# Patient Record
Sex: Male | Born: 1965 | Race: Black or African American | Hispanic: No | Marital: Married | Smoking: Never smoker
Health system: Southern US, Community
[De-identification: ages and names within clinical notes are randomized; demographics above are authoritative.]

## PROBLEM LIST (undated history)

## (undated) DIAGNOSIS — T7840XA Allergy, unspecified, initial encounter: Secondary | ICD-10-CM

## (undated) DIAGNOSIS — Z973 Presence of spectacles and contact lenses: Secondary | ICD-10-CM

## (undated) DIAGNOSIS — I10 Essential (primary) hypertension: Secondary | ICD-10-CM

## (undated) DIAGNOSIS — E785 Hyperlipidemia, unspecified: Secondary | ICD-10-CM

## (undated) DIAGNOSIS — J302 Other seasonal allergic rhinitis: Secondary | ICD-10-CM

## (undated) HISTORY — DX: Hyperlipidemia, unspecified: E78.5

## (undated) HISTORY — DX: Essential (primary) hypertension: I10

## (undated) HISTORY — DX: Allergy, unspecified, initial encounter: T78.40XA

## (undated) HISTORY — PX: COLONOSCOPY: SHX174

## (undated) HISTORY — PX: VASECTOMY: SHX75

---

## 2004-12-19 ENCOUNTER — Ambulatory Visit: Payer: Self-pay | Admitting: Otolaryngology

## 2014-12-17 ENCOUNTER — Ambulatory Visit (INDEPENDENT_AMBULATORY_CARE_PROVIDER_SITE_OTHER): Payer: 59 | Admitting: Family Medicine

## 2014-12-17 ENCOUNTER — Encounter: Payer: Self-pay | Admitting: Family Medicine

## 2014-12-17 ENCOUNTER — Encounter (INDEPENDENT_AMBULATORY_CARE_PROVIDER_SITE_OTHER): Payer: Self-pay

## 2014-12-17 VITALS — BP 118/78 | HR 70 | Temp 98.2°F | Resp 16 | Ht 72.0 in | Wt 232.4 lb

## 2014-12-17 DIAGNOSIS — E349 Endocrine disorder, unspecified: Secondary | ICD-10-CM | POA: Insufficient documentation

## 2014-12-17 DIAGNOSIS — E291 Testicular hypofunction: Secondary | ICD-10-CM | POA: Diagnosis not present

## 2014-12-17 DIAGNOSIS — E785 Hyperlipidemia, unspecified: Secondary | ICD-10-CM | POA: Diagnosis not present

## 2014-12-17 DIAGNOSIS — I10 Essential (primary) hypertension: Secondary | ICD-10-CM | POA: Diagnosis not present

## 2014-12-17 NOTE — Progress Notes (Signed)
Name: Chad Hendricks   MRN: [id number]    DOB: [birth year]   Date:12/17/2014       Progress Note  Subjective  Chief Complaint  Chief Complaint  Patient presents with  . Hypertension  . Hyperlipidemia    Hypertension This is a chronic problem. The current episode started more than 1 year ago. The problem is unchanged. Pertinent negatives include no blurred vision, chest pain, headaches, neck pain, orthopnea, palpitations or shortness of breath. Agents associated with hypertension include steroids. Risk factors for coronary artery disease include dyslipidemia and male gender. Past treatments include ACE inhibitors. The current treatment provides moderate improvement. There are no compliance problems.   Hyperlipidemia This is a chronic problem. The current episode started more than 1 year ago. The problem is uncontrolled. Recent lipid tests were reviewed and are high. Factors aggravating his hyperlipidemia include fatty foods. Pertinent negatives include no chest pain, focal weakness, myalgias or shortness of breath. Current antihyperlipidemic treatment includes statins. The current treatment provides mild improvement of lipids. There are no compliance problems.  Risk factors for coronary artery disease include dyslipidemia, hypertension and male sex.    Hypogonadism  Patient remains in the care of his urologist for hypogonadism. He has received L injections of testosterone the past.. He's had no problem with BPH symptomatology and PSA has been normal by his urologist  Past Medical History  Diagnosis Date  . Hypertension   . Hyperlipidemia   . Allergy     History  Substance Use Topics  . Smoking status: Never Smoker   . Smokeless tobacco: Not on file  . Alcohol Use: 0.0 oz/week    0 Standard drinks or equivalent per week     Current outpatient prescriptions:  .  cetirizine (ZYRTEC) 10 MG tablet, Take by mouth., Disp: , Rfl:  .  Cholecalciferol 50000 UNITS TABS, Take by mouth.,  Disp: , Rfl:  .  clotrimazole-betamethasone (LOTRISONE) cream, , Disp: , Rfl:  .  DHA-EPA-VITAMIN E PO, Take by mouth., Disp: , Rfl:  .  fluticasone (FLONASE) 50 MCG/ACT nasal spray, Place into the nose., Disp: , Rfl:  .  lisinopril (PRINIVIL,ZESTRIL) 20 MG tablet, Take by mouth., Disp: , Rfl:  .  lovastatin (MEVACOR) 40 MG tablet, Take by mouth., Disp: , Rfl:  .  Syringe, Disposable, (2-3CC SYRINGE) 3 ML MISC, Use 1 mL every 14 (fourteen) days., Disp: , Rfl:   Allergies  Allergen Reactions  . Latex Rash    Review of Systems  Constitutional: Negative for fever, chills and weight loss.  HENT: Negative for congestion, hearing loss, sore throat and tinnitus.   Eyes: Negative for blurred vision, double vision and redness.  Respiratory: Negative for cough, hemoptysis and shortness of breath.   Cardiovascular: Negative for chest pain, palpitations, orthopnea, claudication and leg swelling.  Gastrointestinal: Negative for heartburn, nausea, vomiting, diarrhea, constipation and blood in stool.  Genitourinary: Negative for dysuria, urgency, frequency and hematuria.  Musculoskeletal: Positive for joint pain. Negative for myalgias, back pain, falls and neck pain.  Skin: Negative for itching.  Neurological: Negative for dizziness, tingling, tremors, focal weakness, seizures, loss of consciousness, weakness and headaches.  Endo/Heme/Allergies: Does not bruise/bleed easily.  Psychiatric/Behavioral: Negative for depression and substance abuse. The patient is not nervous/anxious and does not have insomnia.      Objective  Filed Vitals:   12/17/14 0920  BP: 118/78  Pulse: 70  Temp: 98.2 F (36.8 C)  Resp: 16  Height: 6' (1.829 m)  Weight: 232 lb  7 oz (105.433 kg)  SpO2: 98%     Physical Exam  Constitutional: He is oriented to person, place, and time and well-developed, well-nourished, and in no distress.  Obese obese  HENT:  Head: Normocephalic.  Eyes: EOM are normal. Pupils are  equal, round, and reactive to light.  Neck: Normal range of motion. Neck supple. No thyromegaly present.  Cardiovascular: Normal rate, regular rhythm and normal heart sounds.   No murmur heard. Pulmonary/Chest: Effort normal and breath sounds normal. No respiratory distress. He has no wheezes.  Abdominal: Soft. Bowel sounds are normal.  Musculoskeletal: He exhibits no edema. Tenderness: both knees.  Lymphadenopathy:    He has no cervical adenopathy.  Neurological: He is alert and oriented to person, place, and time. No cranial nerve deficit. Gait normal. Coordination normal.  Skin: Skin is warm and dry. No rash noted.  Psychiatric: Affect and judgment normal.      Assessment & Plan  1. Hyperlipemia  - Comprehensive metabolic panel - Lipid panel - TSH  2. Essential hypertension Well-controlled  3. Hypotestosteronism  followed by urologist

## 2014-12-17 NOTE — Patient Instructions (Addendum)

## 2014-12-18 ENCOUNTER — Telehealth: Payer: Self-pay | Admitting: Emergency Medicine

## 2014-12-18 LAB — LIPID PANEL
CHOL/HDL RATIO: 2.7 ratio (ref 0.0–5.0)
CHOLESTEROL TOTAL: 178 mg/dL (ref 100–199)
HDL: 66 mg/dL (ref >39)
LDL Calculated: 100 mg/dL — ABNORMAL HIGH (ref 0–99)
Triglycerides: 60 mg/dL (ref 0–149)
VLDL Cholesterol Cal: 12 mg/dL (ref 5–40)

## 2014-12-18 LAB — COMPREHENSIVE METABOLIC PANEL
ALK PHOS: 45 IU/L (ref 39–117)
ALT: 34 IU/L (ref 0–44)
AST: 33 IU/L (ref 0–40)
Albumin/Globulin Ratio: 1.6 (ref 1.1–2.5)
Albumin: 4.4 g/dL (ref 3.5–5.5)
BUN/Creatinine Ratio: 10 (ref 9–20)
BUN: 12 mg/dL (ref 6–24)
Bilirubin Total: 0.5 mg/dL (ref 0.0–1.2)
CO2: 23 mmol/L (ref 18–29)
Calcium: 9.6 mg/dL (ref 8.7–10.2)
Chloride: 98 mmol/L (ref 97–108)
Creatinine, Ser: 1.24 mg/dL (ref 0.76–1.27)
GFR calc Af Amer: 79 mL/min/{1.73_m2} (ref >59)
GFR calc non Af Amer: 68 mL/min/{1.73_m2} (ref >59)
Globulin, Total: 2.7 g/dL (ref 1.5–4.5)
Glucose: 92 mg/dL (ref 65–99)
POTASSIUM: 4.9 mmol/L (ref 3.5–5.2)
SODIUM: 141 mmol/L (ref 134–144)
Total Protein: 7.1 g/dL (ref 6.0–8.5)

## 2014-12-18 LAB — TSH: TSH: 1.1 u[IU]/mL (ref 0.450–4.500)

## 2014-12-18 NOTE — Telephone Encounter (Signed)
Patient notified of lab results

## 2015-01-04 ENCOUNTER — Other Ambulatory Visit: Payer: Self-pay

## 2015-01-04 MED ORDER — LOVASTATIN 40 MG PO TABS: 40.0000 mg | ORAL_TABLET | Freq: Every day | ORAL | Status: DC

## 2015-01-04 MED ORDER — LISINOPRIL 20 MG PO TABS: 20.0000 mg | ORAL_TABLET | Freq: Every day | ORAL | Status: DC

## 2015-05-06 ENCOUNTER — Ambulatory Visit (INDEPENDENT_AMBULATORY_CARE_PROVIDER_SITE_OTHER): Payer: 59 | Admitting: Family Medicine

## 2015-05-06 ENCOUNTER — Ambulatory Visit
Admission: RE | Admit: 2015-05-06 | Discharge: 2015-05-06 | Disposition: A | Discharge status: Home / Self Care | Payer: 59 | Source: Ambulatory Visit | Attending: Family Medicine | Admitting: Family Medicine

## 2015-05-06 ENCOUNTER — Encounter: Payer: Self-pay | Admitting: Family Medicine

## 2015-05-06 VITALS — BP 128/78 | HR 68 | Temp 98.3°F | Resp 18 | Ht 72.0 in | Wt 234.2 lb

## 2015-05-06 DIAGNOSIS — R0781 Pleurodynia: Secondary | ICD-10-CM

## 2015-05-06 DIAGNOSIS — Z23 Encounter for immunization: Secondary | ICD-10-CM | POA: Diagnosis not present

## 2015-05-06 MED ORDER — MELOXICAM 15 MG PO TABS: 15.0000 mg | ORAL_TABLET | Freq: Every day | ORAL | Status: DC

## 2015-05-06 NOTE — Progress Notes (Signed)
Name: Chad Hendricks   MRN: [id number]    DOB: 01-Sep-1965   Date:05/06/2015       Progress Note  Subjective  Chief Complaint  Chief Complaint  Patient presents with  . Abdominal Pain    left side for 3 weeks    HPI  Abdominal pain  Several week complaint of pain and discomfort in the left lower rib and left upper quadrant area. He admits that he's been doing some exercises which are very intensive recent that it requires a lot of upper abdominal musculature movement. He's had no nausea vomiting or dyspepsia. There's been no true chest pain nausea and vomiting melena or hematochezia. He has been present now for 3 weeks. It is exacerbated by exercise and relieved at times by NSAIDs or warm compresses.    Past Medical History  Diagnosis Date  . Hypertension   . Hyperlipidemia   . Allergy     Social History  Substance Use Topics  . Smoking status: Never Smoker   . Smokeless tobacco: Not on file  . Alcohol Use: 0.0 oz/week    0 Standard drinks or equivalent per week     Current outpatient prescriptions:  .  cetirizine (ZYRTEC) 10 MG tablet, Take by mouth., Disp: , Rfl:  .  Cholecalciferol 50000 UNITS TABS, Take by mouth., Disp: , Rfl:  .  clotrimazole-betamethasone (LOTRISONE) cream, , Disp: , Rfl:  .  DHA-EPA-VITAMIN E PO, Take by mouth., Disp: , Rfl:  .  fluticasone (FLONASE) 50 MCG/ACT nasal spray, Place into the nose., Disp: , Rfl:  .  lisinopril (PRINIVIL,ZESTRIL) 20 MG tablet, Take 1 tablet (20 mg total) by mouth daily., Disp: 90 tablet, Rfl: 1 .  lovastatin (MEVACOR) 40 MG tablet, Take 1 tablet (40 mg total) by mouth at bedtime., Disp: 90 tablet, Rfl: 1 .  Syringe, Disposable, (2-3CC SYRINGE) 3 ML MISC, Use 1 mL every 14 (fourteen) days., Disp: , Rfl:  .  testosterone cypionate (DEPOTESTOSTERONE CYPIONATE) 200 MG/ML injection, INJECT 1 ML IN MUSCLE Q 14 DAYS, Disp: , Rfl: 0  Allergies  Allergen Reactions  . Latex Rash    Review of Systems  Constitutional:  Positive for fever and weight loss.  Cardiovascular: Positive for chest pain.  Gastrointestinal: Positive for abdominal pain.     Objective  Filed Vitals:   05/06/15 1042  BP: 128/78  Pulse: 68  Temp: 98.3 F (36.8 C)  Resp: 18  Height: 6' (1.829 m)  Weight: 234 lb 3 oz (106.227 kg)  SpO2: 92%     Physical Exam  Constitutional: He is oriented to person, place, and time and well-developed, well-nourished, and in no distress.  HENT:  Head: Normocephalic.  Eyes: EOM are normal. Pupils are equal, round, and reactive to light.  Neck: Normal range of motion. Neck supple. No thyromegaly present.  Cardiovascular: Normal rate, regular rhythm and normal heart sounds.   No murmur heard. Some discomfort in the left lower rib areas. No step-off or point tenderness  Pulmonary/Chest: Effort normal and breath sounds normal. No respiratory distress. He has no wheezes.  Abdominal: Soft. Bowel sounds are normal.  Tenderness in the rectus abdominis muscles just below the insertion of the left lower rib cartilages  Musculoskeletal: Normal range of motion. He exhibits no edema.  Lymphadenopathy:    He has no cervical adenopathy.  Neurological: He is alert and oriented to person, place, and time. No cranial nerve deficit. Gait normal. Coordination normal.  Skin: Skin is warm and dry. No  rash noted.  Psychiatric: Affect and judgment normal.      Assessment & Plan   1. Need for influenza vaccination Given - Flu Vaccine QUAD 36+ mos PF IM (Fluarix & Fluzone Quad PF)  2. Rib pain on left side Abdominal and rib pain seems to be more localized to the rectus abdominis area and over doing it with his intense exercise regimen currently is encouraged to decrease this. However to be unsafe side obtain x-ray of the ribs recently some meloxicam for the pain and use warm compresses when necessary.  Differential diagnosis includes a remote possibility of splenic hemorrhage or cardiac source - DG Ribs  Unilateral Left; Future - meloxicam (MOBIC) 15 MG tablet; Take 1 tablet (15 mg total) by mouth daily.  Dispense: 30 tablet; Refill: 1

## 2015-05-08 ENCOUNTER — Telehealth: Payer: Self-pay | Admitting: Emergency Medicine

## 2015-05-08 NOTE — Telephone Encounter (Signed)
Left message for patient of xray results

## 2015-05-16 ENCOUNTER — Other Ambulatory Visit: Payer: Self-pay | Admitting: Family Medicine

## 2015-06-19 ENCOUNTER — Ambulatory Visit: Payer: 59 | Admitting: Family Medicine

## 2015-06-21 ENCOUNTER — Encounter: Payer: Self-pay | Admitting: Family Medicine

## 2015-06-21 ENCOUNTER — Encounter (INDEPENDENT_AMBULATORY_CARE_PROVIDER_SITE_OTHER): Payer: Self-pay

## 2015-06-21 ENCOUNTER — Ambulatory Visit (INDEPENDENT_AMBULATORY_CARE_PROVIDER_SITE_OTHER): Payer: 59 | Admitting: Family Medicine

## 2015-06-21 VITALS — BP 138/78 | HR 64 | Temp 98.5°F | Resp 18 | Ht 72.0 in | Wt 234.6 lb

## 2015-06-21 DIAGNOSIS — Z13220 Encounter for screening for lipoid disorders: Secondary | ICD-10-CM | POA: Diagnosis not present

## 2015-06-21 DIAGNOSIS — I10 Essential (primary) hypertension: Secondary | ICD-10-CM

## 2015-06-21 DIAGNOSIS — R109 Unspecified abdominal pain: Secondary | ICD-10-CM

## 2015-06-21 MED ORDER — CLOTRIMAZOLE-BETAMETHASONE 1-0.05 % EX LOTN: TOPICAL_LOTION | Freq: Two times a day (BID) | CUTANEOUS | Status: DC

## 2015-06-21 NOTE — Progress Notes (Signed)
Name: Chad Hendricks   MRN: [id number]    DOB: 1965/11/23   Date:06/21/2015       Progress Note  Subjective  Chief Complaint  Chief Complaint  Patient presents with  . Hyperlipidemia    pt here for 6 month follow up  . testicular hypofunction  . Hypertension    HPI  Hypertension   Patient presents for follow-up of hypertension. It has been present for over 5 years.  Patient states that there is compliance with medical regimen which consists of lisinopril 20 mg daily . There is no end organ disease. Cardiac risk factors include hypertension hyperlipidemia and diabetes.  Exercise regimen consist of regular running and walking .  Diet consist of salt restriction .  Hyperlipidemia  Patient has a history of hyperlipidemia for over 5 years.  Current medical regimen consist of lovastatin 40 mg daily at bedtime .  Compliance is good .  Diet and exercise are currently followed regularly .  Risk factors for cardiovascular disease include hyperlipidemia hypertension .   There have been no side effects from the medication.      Hypo-testosterone is in.  Patient is being followed by endocrinologist for his hypo-testosterone is. He is on testosterone cypionate 200 mg/mL testosterone levels and PSA being [facility]    left-sided abdominal pain    patient has a several month history now of left lower rib and left upper quadrant pain. This has been thought secondary to exercise size increase. He has not improved with using NSAIDs.   Past Medical History  Diagnosis Date  . Hypertension   . Hyperlipidemia   . Allergy     Social History  Substance Use Topics  . Smoking status: Never Smoker   . Smokeless tobacco: Not on file  . Alcohol Use: 0.0 oz/week    0 Standard drinks or equivalent per week     Current outpatient prescriptions:  .  testosterone enanthate (DELATESTRYL) 200 MG/ML injection, Inject into the muscle., Disp: , Rfl:  .  cetirizine (ZYRTEC) 10 MG  tablet, Take by mouth., Disp: , Rfl:  .  Cholecalciferol 50000 UNITS TABS, Take by mouth., Disp: , Rfl:  .  clotrimazole-betamethasone (LOTRISONE) cream, , Disp: , Rfl:  .  DHA-EPA-VITAMIN E PO, Take by mouth., Disp: , Rfl:  .  fluticasone (FLONASE) 50 MCG/ACT nasal spray, Place into the nose., Disp: , Rfl:  .  lisinopril (PRINIVIL,ZESTRIL) 20 MG tablet, Take 1 tablet by mouth  daily, Disp: 90 tablet, Rfl: 1 .  lovastatin (MEVACOR) 40 MG tablet, Take 1 tablet by mouth at  bedtime, Disp: 90 tablet, Rfl: 1 .  meloxicam (MOBIC) 15 MG tablet, Take 1 tablet (15 mg total) by mouth daily., Disp: 30 tablet, Rfl: 1 .  Syringe, Disposable, (2-3CC SYRINGE) 3 ML MISC, Use 1 mL every 14 (fourteen) days., Disp: , Rfl:  .  testosterone cypionate (DEPOTESTOSTERONE CYPIONATE) 200 MG/ML injection, INJECT 1 ML IN MUSCLE Q 14 DAYS, Disp: , Rfl: 0  Allergies  Allergen Reactions  . Latex Rash    Review of Systems  Constitutional: Negative for fever, chills and weight loss.  HENT: Negative for congestion, hearing loss, sore throat and tinnitus.   Eyes: Negative for blurred vision, double vision and redness.  Respiratory: Negative for cough, hemoptysis and shortness of breath.   Cardiovascular: Negative for chest pain, palpitations, orthopnea, claudication and leg swelling.  Gastrointestinal: Positive for abdominal pain. Negative for heartburn, nausea, vomiting, diarrhea, constipation and blood in stool.  Genitourinary: Negative for dysuria, urgency, frequency and hematuria.  Musculoskeletal: Negative for myalgias, back pain, joint pain, falls and neck pain.       Left lower rib pain  Skin: Negative for itching.  Neurological: Negative for dizziness, tingling, tremors, focal weakness, seizures, loss of consciousness, weakness and headaches.  Endo/Heme/Allergies: Does not bruise/bleed easily.  Psychiatric/Behavioral: Negative for depression and substance abuse. The patient is not nervous/anxious and does not  have insomnia.      Objective  Filed Vitals:   06/21/15 0949  BP: 138/78  Pulse: 64  Temp: 98.5 F (36.9 C)  Resp: 18  Height: 6' (1.829 m)  Weight: 234 lb 9 oz (106.397 kg)  SpO2: 97%     Physical Exam  Constitutional: He is oriented to person, place, and time and well-developed, well-nourished, and in no distress.  HENT:  Head: Normocephalic.  Eyes: EOM are normal. Pupils are equal, round, and reactive to light.  Neck: Normal range of motion. Neck supple. No thyromegaly present.  Cardiovascular: Normal rate, regular rhythm and normal heart sounds.   No murmur heard. Pulmonary/Chest: Effort normal and breath sounds normal. No respiratory distress. He has no wheezes.  Abdominal: Soft. Bowel sounds are normal. There is tenderness.  Musculoskeletal: Normal range of motion. He exhibits no edema.  Lymphadenopathy:    He has no cervical adenopathy.  Neurological: He is alert and oriented to person, place, and time. No cranial nerve deficit. Gait normal. Coordination normal.  Skin: Skin is warm and dry. No rash noted.  Psychiatric: Affect and judgment normal.      Assessment & Plan  1. Left sided abdominal pain Ultrasound and lab work - US Abdomen Limited; Future - CBC - Sed Rate (ESR)  2. Essential hypertension Well-controlled - CBC - Comprehensive Metabolic Panel (CMET) - Sed Rate (ESR)  3. Lipid screening Lipid panel - Lipid panel - TSH - Sed Rate (ESR)

## 2015-06-22 LAB — LIPID PANEL
Chol/HDL Ratio: 4 ratio units (ref 0.0–5.0)
Cholesterol, Total: 194 mg/dL (ref 100–199)
HDL: 49 mg/dL (ref >39)
LDL Calculated: 106 mg/dL — ABNORMAL HIGH (ref 0–99)
TRIGLYCERIDES: 197 mg/dL — AB (ref 0–149)
VLDL CHOLESTEROL CAL: 39 mg/dL (ref 5–40)

## 2015-06-22 LAB — CBC
HEMATOCRIT: 50.6 % (ref 37.5–51.0)
HEMOGLOBIN: 17.1 g/dL (ref 12.6–17.7)
MCH: 29.3 pg (ref 26.6–33.0)
MCHC: 33.8 g/dL (ref 31.5–35.7)
MCV: 87 fL (ref 79–97)
PLATELETS: 205 10*3/uL (ref 150–379)
RBC: 5.84 x10E6/uL — AB (ref 4.14–5.80)
RDW: 13.5 % (ref 12.3–15.4)
WBC: 4.1 10*3/uL (ref 3.4–10.8)

## 2015-06-22 LAB — COMPREHENSIVE METABOLIC PANEL
A/G RATIO: 1.6 (ref 1.1–2.5)
ALT: 29 IU/L (ref 0–44)
AST: 24 IU/L (ref 0–40)
Albumin: 4.5 g/dL (ref 3.5–5.5)
Alkaline Phosphatase: 48 IU/L (ref 39–117)
BILIRUBIN TOTAL: 0.5 mg/dL (ref 0.0–1.2)
BUN/Creatinine Ratio: 9 (ref 9–20)
BUN: 12 mg/dL (ref 6–24)
CALCIUM: 9.7 mg/dL (ref 8.7–10.2)
CHLORIDE: 98 mmol/L (ref 96–106)
CO2: 25 mmol/L (ref 18–29)
Creatinine, Ser: 1.3 mg/dL — ABNORMAL HIGH (ref 0.76–1.27)
GFR calc non Af Amer: 64 mL/min/{1.73_m2} (ref >59)
GFR, EST AFRICAN AMERICAN: 74 mL/min/{1.73_m2} (ref >59)
GLUCOSE: 56 mg/dL — AB (ref 65–99)
Globulin, Total: 2.8 g/dL (ref 1.5–4.5)
POTASSIUM: 4.1 mmol/L (ref 3.5–5.2)
Sodium: 139 mmol/L (ref 134–144)
TOTAL PROTEIN: 7.3 g/dL (ref 6.0–8.5)

## 2015-06-22 LAB — TSH: TSH: 1.38 u[IU]/mL (ref 0.450–4.500)

## 2015-06-22 LAB — SEDIMENTATION RATE: Sed Rate: 2 mm/hr (ref 0–15)

## 2015-07-02 ENCOUNTER — Ambulatory Visit
Admission: RE | Admit: 2015-07-02 | Discharge: 2015-07-02 | Disposition: A | Discharge status: Home / Self Care | Payer: 59 | Source: Ambulatory Visit | Attending: Family Medicine | Admitting: Family Medicine

## 2015-07-02 DIAGNOSIS — R05 Cough: Secondary | ICD-10-CM | POA: Insufficient documentation

## 2015-07-02 DIAGNOSIS — R109 Unspecified abdominal pain: Secondary | ICD-10-CM | POA: Diagnosis present

## 2015-07-08 ENCOUNTER — Telehealth: Payer: Self-pay | Admitting: Emergency Medicine

## 2015-07-08 NOTE — Telephone Encounter (Signed)
Patient notified of results. Will call back if he want the referral to GI

## 2015-07-17 ENCOUNTER — Encounter: Payer: 59 | Admitting: Family Medicine

## 2015-11-26 ENCOUNTER — Other Ambulatory Visit: Payer: Self-pay | Admitting: Family Medicine

## 2016-05-09 ENCOUNTER — Other Ambulatory Visit: Payer: Self-pay | Admitting: Family Medicine

## 2016-06-15 ENCOUNTER — Other Ambulatory Visit: Payer: Self-pay | Admitting: Family Medicine

## 2016-06-15 ENCOUNTER — Ambulatory Visit (INDEPENDENT_AMBULATORY_CARE_PROVIDER_SITE_OTHER): Payer: 59 | Admitting: Family Medicine

## 2016-06-15 ENCOUNTER — Encounter: Payer: Self-pay | Admitting: Family Medicine

## 2016-06-15 VITALS — BP 128/76 | HR 56 | Temp 98.1°F | Resp 18 | Ht 72.0 in | Wt 242.9 lb

## 2016-06-15 DIAGNOSIS — I10 Essential (primary) hypertension: Secondary | ICD-10-CM

## 2016-06-15 DIAGNOSIS — Z23 Encounter for immunization: Secondary | ICD-10-CM | POA: Diagnosis not present

## 2016-06-15 DIAGNOSIS — Z1211 Encounter for screening for malignant neoplasm of colon: Secondary | ICD-10-CM

## 2016-06-15 DIAGNOSIS — E785 Hyperlipidemia, unspecified: Secondary | ICD-10-CM | POA: Diagnosis not present

## 2016-06-15 MED ORDER — LISINOPRIL 20 MG PO TABS: 20.0000 mg | ORAL_TABLET | Freq: Every day | ORAL | 1 refills | Status: DC

## 2016-06-15 MED ORDER — LISINOPRIL 20 MG PO TABS: 20.0000 mg | ORAL_TABLET | Freq: Every day | ORAL | 0 refills | Status: DC

## 2016-06-15 MED ORDER — LOVASTATIN 40 MG PO TABS: 40.0000 mg | ORAL_TABLET | Freq: Every day | ORAL | 1 refills | Status: DC

## 2016-06-15 NOTE — Progress Notes (Signed)
Name: Chad Hendricks   MRN: [id number]    DOB: 1966/03/10   Date:06/15/2016       Progress Note  Subjective  Chief Complaint  Chief Complaint  Patient presents with  . Hypertension    6 month folow up, medication refill  . Hyperlipidemia    Hypertension  This is a chronic problem. The problem is unchanged. The problem is controlled. Pertinent negatives include no blurred vision, chest pain, headaches, palpitations or shortness of breath. Past treatments include ACE inhibitors. There is no history of kidney disease, CAD/MI or CVA.  Hyperlipidemia  This is a chronic problem. The problem is controlled. Recent lipid tests were reviewed and are high. Pertinent negatives include no chest pain, leg pain, myalgias or shortness of breath. Current antihyperlipidemic treatment includes statins. Risk factors for coronary artery disease include dyslipidemia and male sex.   Referral for Colonoscopy: Last colonoscopy was performed in 2013 by Dr. Allen Norris, he found 3 polyps which were removed and advised to folow up with repeat colonoscopy in 2018. His father was diagnosed with colon cancer at age 31 years old.   Past Medical History:  Diagnosis Date  . Allergy   . Hyperlipidemia   . Hypertension     Past Surgical History:  Procedure Laterality Date  . VASECTOMY      Family History  Problem Relation Age of Onset  . Cancer Mother   . Kidney disease Mother   . Kidney disease Father   . Prostate cancer Brother   . Prostate cancer Brother   . Kidney disease Brother   . Diabetes Brother   . Diabetes Brother   . Diabetes Brother     Social History   Social History  . Marital status: Married    Spouse name: N/A  . Number of children: N/A  . Years of education: N/A   Occupational History  . Not on file.   Social History Main Topics  . Smoking status: Never Smoker  . Smokeless tobacco: Not on file  . Alcohol use 0.0 oz/week  . Drug use: No  . Sexual activity: Not on file   Other  Topics Concern  . Not on file   Social History Narrative  . No narrative on file     Current Outpatient Prescriptions:  .  cetirizine (ZYRTEC) 10 MG tablet, Take by mouth., Disp: , Rfl:  .  Cholecalciferol 50000 UNITS TABS, Take by mouth., Disp: , Rfl:  .  DHA-EPA-VITAMIN E PO, Take by mouth., Disp: , Rfl:  .  fluticasone (FLONASE) 50 MCG/ACT nasal spray, Place into the nose., Disp: , Rfl:  .  lisinopril (PRINIVIL,ZESTRIL) 20 MG tablet, Take 1 tablet by mouth  daily, Disp: 90 tablet, Rfl: 0 .  lovastatin (MEVACOR) 40 MG tablet, Take 1 tablet by mouth at  bedtime, Disp: 90 tablet, Rfl: 0 .  meloxicam (MOBIC) 15 MG tablet, Take 1 tablet (15 mg total) by mouth daily., Disp: 30 tablet, Rfl: 1 .  Syringe, Disposable, (2-3CC SYRINGE) 3 ML MISC, Use 1 mL every 14 (fourteen) days., Disp: , Rfl:  .  testosterone cypionate (DEPOTESTOSTERONE CYPIONATE) 200 MG/ML injection, INJECT 1 ML IN MUSCLE Q 14 DAYS, Disp: , Rfl: 0 .  testosterone enanthate (DELATESTRYL) 200 MG/ML injection, Inject into the muscle., Disp: , Rfl:  .  clotrimazole-betamethasone (LOTRISONE) cream, , Disp: , Rfl:   Allergies  Allergen Reactions  . Latex Rash     Review of Systems  Constitutional: Negative for fever and weight loss.  Eyes: Negative for blurred vision.  Respiratory: Negative for shortness of breath.   Cardiovascular: Negative for chest pain and palpitations.  Gastrointestinal: Negative for abdominal pain, blood in stool, constipation, diarrhea, nausea and vomiting.  Musculoskeletal: Negative for myalgias.  Neurological: Negative for headaches.     Objective  Vitals:   06/15/16 1130  BP: 128/76  Pulse: (!) 56  Resp: 18  Temp: 98.1 F (36.7 C)  TempSrc: Oral  SpO2: 97%  Weight: 242 lb 14.4 oz (110.2 kg)  Height: 6' (1.829 m)    Physical Exam  Constitutional: He is oriented to person, place, and time and well-developed, well-nourished, and in no distress.  HENT:  Head: Normocephalic and  atraumatic.  Cardiovascular: Normal rate, regular rhythm and normal heart sounds.   No murmur heard. Pulmonary/Chest: Effort normal and breath sounds normal. He has no wheezes.  Abdominal: Soft. Bowel sounds are normal. There is no tenderness.  Musculoskeletal:       Right ankle: He exhibits no swelling.       Left ankle: He exhibits no swelling.  Neurological: He is alert and oriented to person, place, and time.  Psychiatric: Mood, memory, affect and judgment normal.  Nursing note and vitals reviewed.    Assessment & Plan  1. Need for influenza vaccination  - Flu Vaccine QUAD 36+ mos PF IM (Fluarix & Fluzone Quad PF)  2. Colon cancer screening  - Ambulatory referral to Gastroenterology  3. Essential hypertension  - lisinopril (PRINIVIL,ZESTRIL) 20 MG tablet; Take 1 tablet (20 mg total) by mouth daily.  Dispense: 90 tablet; Refill: 1  4. Hyperlipidemia, unspecified hyperlipidemia type  - Lipid Profile - COMPLETE METABOLIC PANEL WITH GFR - lovastatin (MEVACOR) 40 MG tablet; Take 1 tablet (40 mg total) by mouth at bedtime.  Dispense: 90 tablet; Refill: 1  Deeanne Deininger [facility] 06/15/2016 11:40 AM

## 2016-06-16 LAB — COMPREHENSIVE METABOLIC PANEL
A/G RATIO: 1.7 (ref 1.2–2.2)
ALT: 40 IU/L (ref 0–44)
AST: 35 IU/L (ref 0–40)
Albumin: 4.4 g/dL (ref 3.5–5.5)
Alkaline Phosphatase: 41 IU/L (ref 39–117)
BUN/Creatinine Ratio: 9 (ref 9–20)
BUN: 12 mg/dL (ref 6–24)
Bilirubin Total: 0.9 mg/dL (ref 0.0–1.2)
CALCIUM: 9.7 mg/dL (ref 8.7–10.2)
CO2: 29 mmol/L (ref 18–29)
CREATININE: 1.34 mg/dL — AB (ref 0.76–1.27)
Chloride: 99 mmol/L (ref 96–106)
GFR, EST AFRICAN AMERICAN: 71 mL/min/{1.73_m2} (ref >59)
GFR, EST NON AFRICAN AMERICAN: 61 mL/min/{1.73_m2} (ref >59)
GLOBULIN, TOTAL: 2.6 g/dL (ref 1.5–4.5)
Glucose: 74 mg/dL (ref 65–99)
POTASSIUM: 4.7 mmol/L (ref 3.5–5.2)
Sodium: 141 mmol/L (ref 134–144)
TOTAL PROTEIN: 7 g/dL (ref 6.0–8.5)

## 2016-06-16 LAB — LIPID PANEL WITH LDL/HDL RATIO
CHOLESTEROL TOTAL: 206 mg/dL — AB (ref 100–199)
HDL: 54 mg/dL (ref >39)
LDL Calculated: 136 mg/dL — ABNORMAL HIGH (ref 0–99)
LDL/HDL RATIO: 2.5 ratio (ref 0.0–3.6)
Triglycerides: 82 mg/dL (ref 0–149)
VLDL Cholesterol Cal: 16 mg/dL (ref 5–40)

## 2016-07-02 ENCOUNTER — Telehealth: Payer: Self-pay

## 2016-07-02 ENCOUNTER — Other Ambulatory Visit: Payer: Self-pay

## 2016-07-02 NOTE — Telephone Encounter (Signed)
Gastroenterology Pre-Procedure Review  Request Date:  Requesting Physician: Dr.   PATIENT REVIEW QUESTIONS: The patient responded to the following health history questions as indicated:    1. Are you having any GI issues? no 2. Do you have a personal history of Polyps? yes (Colon polyps) 3. Do you have a family history of Colon Cancer or Polyps? yes (Yes, father) 4. Diabetes Mellitus? no 5. Joint replacements in the past 12 months?no 6. Major health problems in the past 3 months?no 7. Any artificial heart valves, MVP, or defibrillator?no    MEDICATIONS & ALLERGIES:    Patient reports the following regarding taking any anticoagulation/antiplatelet therapy:   Plavix, Coumadin, Eliquis, Xarelto, Lovenox, Pradaxa, Brilinta, or Effient? no Aspirin? no  Patient confirms/reports the following medications:  Current Outpatient Prescriptions  Medication Sig Dispense Refill  . cetirizine (ZYRTEC) 10 MG tablet Take by mouth.    . Cholecalciferol 50000 UNITS TABS Take by mouth.    . clotrimazole-betamethasone (LOTRISONE) cream     . DHA-EPA-VITAMIN E PO Take by mouth.    . fluticasone (FLONASE) 50 MCG/ACT nasal spray Place into the nose.    Marland Kitchen lisinopril (PRINIVIL,ZESTRIL) 20 MG tablet Take 1 tablet (20 mg total) by mouth daily. 90 tablet 1  . lisinopril (PRINIVIL,ZESTRIL) 20 MG tablet Take 1 tablet (20 mg total) by mouth daily. 30 tablet 0  . lovastatin (MEVACOR) 40 MG tablet Take 1 tablet (40 mg total) by mouth at bedtime. 90 tablet 1  . meloxicam (MOBIC) 15 MG tablet Take 1 tablet (15 mg total) by mouth daily. 30 tablet 1  . Syringe, Disposable, (2-3CC SYRINGE) 3 ML MISC Use 1 mL every 14 (fourteen) days.    Marland Kitchen testosterone cypionate (DEPOTESTOSTERONE CYPIONATE) 200 MG/ML injection INJECT 1 ML IN MUSCLE Q 14 DAYS  0  . testosterone enanthate (DELATESTRYL) 200 MG/ML injection Inject into the muscle.     No current facility-administered medications for this visit.     Patient  confirms/reports the following allergies:  Allergies  Allergen Reactions  . Latex Rash    No orders of the defined types were placed in this encounter.   AUTHORIZATION INFORMATION Primary Insurance: 1D#: Group #:  Secondary Insurance: 1D#: Group #:  SCHEDULE INFORMATION: Date: 07/20/16 Time: Location: [facility]

## 2016-07-16 ENCOUNTER — Encounter: Payer: Self-pay | Admitting: *Deleted

## 2016-07-17 NOTE — Discharge Instructions (Signed)

## 2016-07-20 ENCOUNTER — Ambulatory Visit
Admission: RE | Admit: 2016-07-20 | Discharge: 2016-07-20 | Disposition: A | Discharge status: Home / Self Care | Payer: 59 | Source: Ambulatory Visit | Attending: Gastroenterology | Admitting: Gastroenterology

## 2016-07-20 ENCOUNTER — Ambulatory Visit: Payer: 59 | Admitting: Anesthesiology

## 2016-07-20 ENCOUNTER — Encounter
Admission: RE | Disposition: A | Discharge status: Home / Self Care | Payer: Self-pay | Source: Ambulatory Visit | Attending: Gastroenterology

## 2016-07-20 DIAGNOSIS — K640 First degree hemorrhoids: Secondary | ICD-10-CM | POA: Diagnosis not present

## 2016-07-20 DIAGNOSIS — Z79899 Other long term (current) drug therapy: Secondary | ICD-10-CM | POA: Insufficient documentation

## 2016-07-20 DIAGNOSIS — E785 Hyperlipidemia, unspecified: Secondary | ICD-10-CM | POA: Insufficient documentation

## 2016-07-20 DIAGNOSIS — Z860100 Personal history of colon polyps, unspecified: Secondary | ICD-10-CM

## 2016-07-20 DIAGNOSIS — D120 Benign neoplasm of cecum: Secondary | ICD-10-CM

## 2016-07-20 DIAGNOSIS — Z1211 Encounter for screening for malignant neoplasm of colon: Secondary | ICD-10-CM | POA: Diagnosis not present

## 2016-07-20 DIAGNOSIS — I10 Essential (primary) hypertension: Secondary | ICD-10-CM | POA: Insufficient documentation

## 2016-07-20 DIAGNOSIS — Z8719 Personal history of other diseases of the digestive system: Secondary | ICD-10-CM | POA: Diagnosis not present

## 2016-07-20 DIAGNOSIS — Z7951 Long term (current) use of inhaled steroids: Secondary | ICD-10-CM | POA: Diagnosis not present

## 2016-07-20 DIAGNOSIS — D122 Benign neoplasm of ascending colon: Secondary | ICD-10-CM | POA: Diagnosis not present

## 2016-07-20 HISTORY — PX: COLONOSCOPY WITH PROPOFOL: SHX5780

## 2016-07-20 HISTORY — DX: Presence of spectacles and contact lenses: Z97.3

## 2016-07-20 SURGERY — COLONOSCOPY WITH PROPOFOL
Anesthesia: Monitor Anesthesia Care | Wound class: Contaminated

## 2016-07-20 MED ORDER — OXYCODONE HCL 5 MG PO TABS: 5.0000 mg | ORAL_TABLET | Freq: Once | ORAL | Status: DC | PRN

## 2016-07-20 MED ORDER — LACTATED RINGERS IV SOLN
INTRAVENOUS | Status: DC | PRN
  Administered 2016-07-20: 09:00:00 via INTRAVENOUS

## 2016-07-20 MED ORDER — LACTATED RINGERS IV SOLN: INTRAVENOUS | Status: DC

## 2016-07-20 MED ORDER — LIDOCAINE HCL (CARDIAC) 20 MG/ML IV SOLN
INTRAVENOUS | Status: DC | PRN
  Administered 2016-07-20: 40 mg via INTRAVENOUS

## 2016-07-20 MED ORDER — STERILE WATER FOR IRRIGATION IR SOLN
Status: DC | PRN
  Administered 2016-07-20: 10:00:00

## 2016-07-20 MED ORDER — OXYCODONE HCL 5 MG/5ML PO SOLN: 5.0000 mg | Freq: Once | ORAL | Status: DC | PRN

## 2016-07-20 MED ORDER — PROPOFOL 10 MG/ML IV BOLUS
INTRAVENOUS | Status: DC | PRN
  Administered 2016-07-20 (×6): 50 mg via INTRAVENOUS
  Administered 2016-07-20: 100 mg via INTRAVENOUS
  Administered 2016-07-20: 50 mg via INTRAVENOUS

## 2016-07-20 SURGICAL SUPPLY — 23 items
CANISTER SUCT 1200ML W/VALVE (MISCELLANEOUS) ×2 IMPLANT
CLIP HMST 235XBRD CATH ROT (MISCELLANEOUS) IMPLANT
CLIP RESOLUTION 360 11X235 (MISCELLANEOUS)
FCP ESCP3.2XJMB 240X2.8X (MISCELLANEOUS)
FORCEPS BIOP RAD 4 LRG CAP 4 (CUTTING FORCEPS) IMPLANT
FORCEPS BIOP RJ4 240 W/NDL (MISCELLANEOUS)
FORCEPS ESCP3.2XJMB 240X2.8X (MISCELLANEOUS) IMPLANT
GOWN CVR UNV OPN BCK APRN NK (MISCELLANEOUS) ×2 IMPLANT
GOWN ISOL THUMB LOOP REG UNIV (MISCELLANEOUS) ×2
INJECTOR VARIJECT VIN23 (MISCELLANEOUS) IMPLANT
KIT DEFENDO VALVE AND CONN (KITS) IMPLANT
KIT ENDO PROCEDURE OLY (KITS) ×2 IMPLANT
MARKER SPOT ENDO TATTOO 5ML (MISCELLANEOUS) IMPLANT
PAD GROUND ADULT SPLIT (MISCELLANEOUS) IMPLANT
PROBE APC STR FIRE (PROBE) IMPLANT
RETRIEVER NET ROTH 2.5X230 LF (MISCELLANEOUS) IMPLANT
SNARE SHORT THROW 13M SML OVAL (MISCELLANEOUS) ×2 IMPLANT
SNARE SHORT THROW 30M LRG OVAL (MISCELLANEOUS) IMPLANT
SNARE SNG USE RND 15MM (INSTRUMENTS) IMPLANT
SPOT EX ENDOSCOPIC TATTOO (MISCELLANEOUS)
TRAP ETRAP POLY (MISCELLANEOUS) ×2 IMPLANT
VARIJECT INJECTOR VIN23 (MISCELLANEOUS)
WATER STERILE IRR 250ML POUR (IV SOLUTION) ×2 IMPLANT

## 2016-07-20 NOTE — H&P (Signed)
Lucilla Lame, MD [facility] [address] [place], [place] [address] Phone: [phone] Fax : [phone]  Primary Care Physician:  Ashok Norris, MD Primary Gastroenterologist:  Dr. Allen Norris  Pre-Procedure History & Physical: HPI:  Chad Hendricks is a 51 y.o. male is here for an colonoscopy.   Past Medical History:  Diagnosis Date  . Allergy   . Hyperlipidemia   . Hypertension   . Wears contact lenses     Past Surgical History:  Procedure Laterality Date  . COLONOSCOPY    . VASECTOMY      Prior to Admission medications   Medication Sig Start Date End Date Taking? Authorizing Provider  cetirizine (ZYRTEC) 10 MG tablet Take by mouth.   Yes Historical Provider, MD  Cholecalciferol 50000 UNITS TABS Take by mouth. 09/27/08  Yes Historical Provider, MD  DHA-EPA-VITAMIN E PO Take by mouth. 11/03/07  Yes Historical Provider, MD  fluticasone (FLONASE) 50 MCG/ACT nasal spray Place into the nose. 01/10/10  Yes Historical Provider, MD  lisinopril (PRINIVIL,ZESTRIL) 20 MG tablet Take 1 tablet (20 mg total) by mouth daily. 06/15/16  Yes Roselee Nova, MD  lovastatin (MEVACOR) 40 MG tablet Take 1 tablet (40 mg total) by mouth at bedtime. 06/15/16  Yes Roselee Nova, MD  testosterone cypionate (DEPOTESTOSTERONE CYPIONATE) 200 MG/ML injection INJECT 1 ML IN MUSCLE Q 14 DAYS 04/30/15  Yes Historical Provider, MD  lisinopril (PRINIVIL,ZESTRIL) 20 MG tablet Take 1 tablet (20 mg total) by mouth daily. 06/15/16   Roselee Nova, MD  Syringe, Disposable, (2-3CC SYRINGE) 3 ML MISC Use 1 mL every 14 (fourteen) days. 01/23/13   Historical Provider, MD  testosterone enanthate (DELATESTRYL) 200 MG/ML injection Inject into the muscle. 05/28/15   Historical Provider, MD    Allergies as of 07/02/2016 - Review Complete 06/15/2016  Allergen Reaction Noted  . Latex Rash 12/17/2014    Family History  Problem Relation Age of Onset  . Cancer Mother   . Kidney disease Mother   . Kidney disease Father    . Prostate cancer Brother   . Prostate cancer Brother   . Kidney disease Brother   . Diabetes Brother   . Diabetes Brother   . Diabetes Brother     Social History   Social History  . Marital status: Married    Spouse name: N/A  . Number of children: N/A  . Years of education: N/A   Occupational History  . Not on file.   Social History Main Topics  . Smoking status: Never Smoker  . Smokeless tobacco: Never Used  . Alcohol use 1.2 oz/week    2 Glasses of wine per week  . Drug use: No  . Sexual activity: Not on file   Other Topics Concern  . Not on file   Social History Narrative  . No narrative on file    Review of Systems: See HPI, otherwise negative ROS  Physical Exam: BP (!) 141/93   Pulse (!) 54   Temp 98.9 F (37.2 C) (Temporal)   Ht 6' (1.829 m)   Wt 233 lb (105.7 kg)   SpO2 96%   BMI 31.60 kg/m  General:   Alert,  pleasant and cooperative in NAD Head:  Normocephalic and atraumatic. Neck:  Supple; no masses or thyromegaly. Lungs:  Clear throughout to auscultation.    Heart:  Regular rate and rhythm. Abdomen:  Soft, nontender and nondistended. Normal bowel sounds, without guarding, and without rebound.   Neurologic:  Alert and  oriented x4;  grossly normal neurologically.  Impression/Plan: DELONTAE SALIB is here for an colonoscopy to be performed for history of colon polyps  Risks, benefits, limitations, and alternatives regarding  colonoscopy have been reviewed with the patient.  Questions have been answered.  All parties agreeable.   Lucilla Lame, MD  07/20/2016, 9:21 AM

## 2016-07-20 NOTE — Op Note (Signed)
[facility]enter[facility] Patient Name: Chad Hendricks Procedure Date: 07/20/2016 9:22 AM MRN: [id number] Account #: [id number] Date of Birth: [birth year] Admit Type: Outpatient Age: 51 Room: [facility] OR ROOM 01 Gender: Male Note Status: Finalized Procedure:            Colonoscopy Indications:          High risk colon cancer surveillance: Personal history                        of colonic polyps Providers:            Lucilla Lame MD, MD Referring MD:         Ashok Norris, MD (Referring MD) Medicines:            Propofol per Anesthesia Procedure:            Pre-Anesthesia Assessment:                       - Prior to the procedure, a History and Physical was                        performed, and patient medications and allergies were                        reviewed. The patient's tolerance of previous                        anesthesia was also reviewed. The risks and benefits of                        the procedure and the sedation options and risks were                        discussed with the patient. All questions were                        answered, and informed consent was obtained. Prior                        Anticoagulants: The patient has taken no previous                        anticoagulant or antiplatelet agents. ASA Grade                        Assessment: II - A patient with mild systemic disease.                        After reviewing the risks and benefits, the patient was                        deemed in satisfactory condition to undergo the                        procedure.                       After obtaining informed consent, the colonoscope was                        passed under  direct vision. Throughout the procedure,                        the patient's blood pressure, pulse, and oxygen                        saturations were monitored continuously. The [place] (S#: [id number]) was introduced  through                        the anus and advanced to the the cecum, identified by                        appendiceal orifice and ileocecal valve. The                        colonoscopy was performed without difficulty. The                        patient tolerated the procedure well. The quality of                        the bowel preparation was excellent. Findings:      The perianal and digital rectal examinations were normal.      Two sessile polyps were found in the cecum. The polyps were 2 to 3 mm in       size. These polyps were removed with a cold snare. Resection and       retrieval were complete.      A 4 mm polyp was found in the ascending colon. The polyp was       pedunculated. The polyp was removed with a cold snare. Resection and       retrieval were complete.      Non-bleeding internal hemorrhoids were found during retroflexion. The       hemorrhoids were Grade I (internal hemorrhoids that do not prolapse). Impression:           - Two 2 to 3 mm polyps in the cecum, removed with a                        cold snare. Resected and retrieved.                       - One 4 mm polyp in the ascending colon, removed with a                        cold snare. Resected and retrieved.                       - Non-bleeding internal hemorrhoids. Recommendation:       - Discharge patient to home.                       - Resume previous diet.                       - Continue present medications.                       -  Await pathology results.                       - Repeat colonoscopy in 5 years for surveillance. Procedure Code(s):    --- Professional ---                       [phone], Colonoscopy, flexible; with removal of tumor(s),                        polyp(s), or other lesion(s) by snare technique Diagnosis Code(s):    --- Professional ---                       Z86.010, Personal history of colonic polyps                       D12.0, Benign neoplasm of cecum                        D12.2, Benign neoplasm of ascending colon CPT copyright 2016 American Medical Association. All rights reserved. The codes documented in this report are preliminary and upon coder review may  be revised to meet current compliance requirements. Lucilla Lame MD, MD 07/20/2016 9:50:06 AM This report has been signed electronically. Number of Addenda: 0 Note Initiated On: 07/20/2016 9:22 AM Scope Withdrawal Time: 0 hours 7 minutes 34 seconds  Total Procedure Duration: 0 hours 11 minutes 54 seconds       [facility]

## 2016-07-20 NOTE — Anesthesia Procedure Notes (Signed)
Procedure Name: MAC Date/Time: 07/20/2016 9:28 AM Performed by: Janna Arch Pre-anesthesia Checklist: Patient identified, Emergency Drugs available, Suction available and Patient being monitored Patient Re-evaluated:Patient Re-evaluated prior to inductionOxygen Delivery Method: Nasal cannula

## 2016-07-20 NOTE — Anesthesia Postprocedure Evaluation (Signed)
Anesthesia Post Note  Patient: Chad Hendricks  Procedure(s) Performed: Procedure(s) (LRB): COLONOSCOPY WITH PROPOFOL (N/A)  Patient location during evaluation: PACU Anesthesia Type: MAC Level of consciousness: awake and alert Pain management: pain level controlled Vital Signs Assessment: post-procedure vital signs reviewed and stable Respiratory status: spontaneous breathing Cardiovascular status: blood pressure returned to baseline Postop Assessment: no headache Anesthetic complications: no    Jaci Standard, III,  Trypp Heckmann D

## 2016-07-20 NOTE — Anesthesia Preprocedure Evaluation (Signed)
Anesthesia Evaluation  Patient identified by MRN, date of birth, ID band Patient awake    Reviewed: Allergy & Precautions, H&P , NPO status , Patient's Chart, lab work & pertinent test results  Airway Mallampati: II  TM Distance: >3 FB Neck ROM: full    Dental no notable dental hx.    Pulmonary neg pulmonary ROS,    Pulmonary exam normal        Cardiovascular hypertension, On Medications Normal cardiovascular exam     Neuro/Psych negative neurological ROS     GI/Hepatic negative GI ROS, Neg liver ROS,   Endo/Other  negative endocrine ROS  Renal/GU negative Renal ROS  negative genitourinary   Musculoskeletal   Abdominal   Peds  Hematology negative hematology ROS (+)   Anesthesia Other Findings   Reproductive/Obstetrics                             Anesthesia Physical Anesthesia Plan  ASA: II  Anesthesia Plan: MAC   Post-op Pain Management:    Induction:   Airway Management Planned:   Additional Equipment:   Intra-op Plan:   Post-operative Plan:   Informed Consent: I have reviewed the patients History and Physical, chart, labs and discussed the procedure including the risks, benefits and alternatives for the proposed anesthesia with the patient or authorized representative who has indicated his/her understanding and acceptance.     Plan Discussed with:   Anesthesia Plan Comments:         Anesthesia Quick Evaluation  

## 2016-07-20 NOTE — Transfer of Care (Signed)
Immediate Anesthesia Transfer of Care Note  Patient: Chad Hendricks  Procedure(s) Performed: Procedure(s): COLONOSCOPY WITH PROPOFOL (N/A)  Patient Location: PACU  Anesthesia Type: MAC  Level of Consciousness: awake, alert  and patient cooperative  Airway and Oxygen Therapy: Patient Spontanous Breathing and Patient connected to supplemental oxygen  Post-op Assessment: Post-op Vital signs reviewed, Patient's Cardiovascular Status Stable, Respiratory Function Stable, Patent Airway and No signs of Nausea or vomiting  Post-op Vital Signs: Reviewed and stable  Complications: No apparent anesthesia complications

## 2016-07-21 ENCOUNTER — Encounter: Payer: Self-pay | Admitting: Gastroenterology

## 2016-07-22 LAB — SURGICAL PATHOLOGY

## 2016-07-24 ENCOUNTER — Encounter: Payer: Self-pay | Admitting: Gastroenterology

## 2016-11-27 ENCOUNTER — Other Ambulatory Visit: Payer: Self-pay | Admitting: Family Medicine

## 2016-11-27 DIAGNOSIS — E785 Hyperlipidemia, unspecified: Secondary | ICD-10-CM

## 2016-11-27 DIAGNOSIS — I10 Essential (primary) hypertension: Secondary | ICD-10-CM

## 2016-12-14 ENCOUNTER — Ambulatory Visit: Payer: 59 | Admitting: Family Medicine

## 2017-01-25 ENCOUNTER — Other Ambulatory Visit: Payer: Self-pay | Admitting: Emergency Medicine

## 2017-01-25 DIAGNOSIS — I10 Essential (primary) hypertension: Secondary | ICD-10-CM

## 2017-01-25 MED ORDER — LISINOPRIL 20 MG PO TABS: 20.0000 mg | ORAL_TABLET | Freq: Every day | ORAL | 0 refills | Status: DC

## 2017-02-01 ENCOUNTER — Ambulatory Visit: Payer: 59 | Admitting: Family Medicine

## 2017-02-03 ENCOUNTER — Ambulatory Visit (INDEPENDENT_AMBULATORY_CARE_PROVIDER_SITE_OTHER): Payer: 59 | Admitting: Family Medicine

## 2017-02-03 ENCOUNTER — Other Ambulatory Visit: Payer: Self-pay | Admitting: Family Medicine

## 2017-02-03 ENCOUNTER — Encounter: Payer: Self-pay | Admitting: Family Medicine

## 2017-02-03 VITALS — BP 130/73 | HR 60 | Temp 98.0°F | Resp 16 | Ht 73.0 in | Wt 241.9 lb

## 2017-02-03 DIAGNOSIS — I10 Essential (primary) hypertension: Secondary | ICD-10-CM | POA: Diagnosis not present

## 2017-02-03 DIAGNOSIS — E785 Hyperlipidemia, unspecified: Secondary | ICD-10-CM

## 2017-02-03 DIAGNOSIS — Z23 Encounter for immunization: Secondary | ICD-10-CM

## 2017-02-03 DIAGNOSIS — E669 Obesity, unspecified: Secondary | ICD-10-CM | POA: Diagnosis not present

## 2017-02-03 MED ORDER — LOVASTATIN 40 MG PO TABS: 40.0000 mg | ORAL_TABLET | Freq: Every day | ORAL | 1 refills | Status: DC

## 2017-02-03 MED ORDER — LISINOPRIL 20 MG PO TABS: 20.0000 mg | ORAL_TABLET | Freq: Every day | ORAL | 1 refills | Status: DC

## 2017-02-03 MED ORDER — LISINOPRIL 20 MG PO TABS: 20.0000 mg | ORAL_TABLET | Freq: Every day | ORAL | 0 refills | Status: DC

## 2017-02-03 NOTE — Progress Notes (Signed)
Name: Chad Hendricks   MRN: [id number]    DOB: 17-Oct-1965   Date:02/03/2017       Progress Note  Subjective  Chief Complaint  Chief Complaint  Patient presents with  . Follow-up    6 mo  . Medication Refill    Hypertension  This is a chronic problem. The problem is unchanged. The problem is controlled. Pertinent negatives include no blurred vision, chest pain, headaches, palpitations or shortness of breath. Past treatments include ACE inhibitors. There is no history of kidney disease, CAD/MI or CVA.  Hyperlipidemia  This is a chronic problem. The problem is uncontrolled. Recent lipid tests were reviewed and are high. Pertinent negatives include no chest pain, leg pain, myalgias or shortness of breath. Current antihyperlipidemic treatment includes statins. Risk factors for coronary artery disease include dyslipidemia and male sex.    Past Medical History:  Diagnosis Date  . Allergy   . Hyperlipidemia   . Hypertension   . Wears contact lenses     Past Surgical History:  Procedure Laterality Date  . COLONOSCOPY    . COLONOSCOPY WITH PROPOFOL N/A 07/20/2016   Procedure: COLONOSCOPY WITH PROPOFOL;  Surgeon: Lucilla Lame, MD;  Location: [place];  Service: Endoscopy;  Laterality: N/A;  . VASECTOMY      Family History  Problem Relation Age of Onset  . Cancer Mother   . Kidney disease Mother   . Kidney disease Father   . Prostate cancer Brother   . Prostate cancer Brother   . Kidney disease Brother   . Diabetes Brother   . Diabetes Brother   . Diabetes Brother     Social History   Social History  . Marital status: Married    Spouse name: N/A  . Number of children: N/A  . Years of education: N/A   Occupational History  . Not on file.   Social History Main Topics  . Smoking status: Never Smoker  . Smokeless tobacco: Never Used  . Alcohol use 1.2 oz/week    2 Glasses of wine per week  . Drug use: No  . Sexual activity: Not on file   Other Topics  Concern  . Not on file   Social History Narrative  . No narrative on file     Current Outpatient Prescriptions:  .  Omega-3 Fatty Acids (OMEGA 3 500) 500 MG CAPS, Take 500 mg by mouth daily., Disp: , Rfl:  .  cetirizine (ZYRTEC) 10 MG tablet, Take by mouth., Disp: , Rfl:  .  DHA-EPA-VITAMIN E PO, Take by mouth., Disp: , Rfl:  .  fluticasone (FLONASE) 50 MCG/ACT nasal spray, Place into the nose., Disp: , Rfl:  .  lisinopril (PRINIVIL,ZESTRIL) 20 MG tablet, Take 1 tablet (20 mg total) by mouth daily., Disp: 90 tablet, Rfl: 1 .  lisinopril (PRINIVIL,ZESTRIL) 20 MG tablet, Take 1 tablet (20 mg total) by mouth daily., Disp: 10 tablet, Rfl: 0 .  lovastatin (MEVACOR) 40 MG tablet, Take 1 tablet (40 mg total) by mouth at bedtime., Disp: 90 tablet, Rfl: 1 .  Syringe, Disposable, (2-3CC SYRINGE) 3 ML MISC, Use 1 mL every 14 (fourteen) days., Disp: , Rfl:  .  testosterone cypionate (DEPOTESTOSTERONE CYPIONATE) 200 MG/ML injection, INJECT 1 ML IN MUSCLE Q 14 DAYS, Disp: , Rfl: 0 .  testosterone enanthate (DELATESTRYL) 200 MG/ML injection, Inject into the muscle., Disp: , Rfl:   Allergies  Allergen Reactions  . Latex Rash    Only if wears gloves for extended period  Review of Systems  Eyes: Negative for blurred vision.  Respiratory: Negative for shortness of breath.   Cardiovascular: Negative for chest pain and palpitations.  Musculoskeletal: Negative for myalgias.  Neurological: Negative for headaches.      Objective  Vitals:   02/03/17 1415  BP: 130/73  Pulse: 60  Resp: 16  Temp: 98 F (36.7 C)  TempSrc: Oral  SpO2: 98%  Weight: 241 lb 14.4 oz (109.7 kg)  Height: 6\' 1"  (1.854 m)    Physical Exam  Constitutional: He is oriented to person, place, and time and well-developed, well-nourished, and in no distress.  HENT:  Head: Normocephalic and atraumatic.  Cardiovascular: Normal rate, regular rhythm and normal heart sounds.   No murmur heard. Pulmonary/Chest: Effort  normal and breath sounds normal.  Abdominal: Soft. Bowel sounds are normal.  Musculoskeletal: He exhibits no edema.  Neurological: He is alert and oriented to person, place, and time.  Psychiatric: Mood, memory, affect and judgment normal.  Nursing note and vitals reviewed.      Assessment & Plan   1. Essential hypertension BP stable on present hypertensive treatment - lisinopril (PRINIVIL,ZESTRIL) 20 MG tablet; Take 1 tablet (20 mg total) by mouth daily.  Dispense: 90 tablet; Refill: 1 - lisinopril (PRINIVIL,ZESTRIL) 20 MG tablet; Take 1 tablet (20 mg total) by mouth daily.  Dispense: 20 tablet; Refill: 0  2. Hyperlipidemia, unspecified hyperlipidemia type  - lovastatin (MEVACOR) 40 MG tablet; Take 1 tablet (40 mg total) by mouth at bedtime.  Dispense: 90 tablet; Refill: 1 - Lipid panel - lovastatin (MEVACOR) 40 MG tablet; Take 1 tablet (40 mg total) by mouth at bedtime.  Dispense: 90 tablet; Refill: 1  3. Need for influenza vaccination  - Flu Vaccine QUAD 6+ mos PF IM (Fluarix Quad PF)  4. Obesity (BMI 30-39.9) Completed [facility] paperwork, advised to increase physical activity, continue with dietary and lifestyle changes   Juniel Groene [facility] 02/03/2017 2:25 PM

## 2017-02-04 LAB — LIPID PANEL W/O CHOL/HDL RATIO
CHOLESTEROL TOTAL: 185 mg/dL (ref 100–199)
HDL: 54 mg/dL (ref >39)
LDL Calculated: 112 mg/dL — ABNORMAL HIGH (ref 0–99)
TRIGLYCERIDES: 94 mg/dL (ref 0–149)
VLDL Cholesterol Cal: 19 mg/dL (ref 5–40)

## 2017-02-06 ENCOUNTER — Other Ambulatory Visit: Payer: Self-pay | Admitting: Family Medicine

## 2017-02-06 DIAGNOSIS — I10 Essential (primary) hypertension: Secondary | ICD-10-CM

## 2017-02-12 ENCOUNTER — Other Ambulatory Visit: Payer: Self-pay | Admitting: Family Medicine

## 2017-04-12 ENCOUNTER — Other Ambulatory Visit: Payer: Self-pay | Admitting: Family Medicine

## 2017-04-12 DIAGNOSIS — I10 Essential (primary) hypertension: Secondary | ICD-10-CM

## 2017-04-12 DIAGNOSIS — E785 Hyperlipidemia, unspecified: Secondary | ICD-10-CM

## 2017-05-31 ENCOUNTER — Other Ambulatory Visit: Payer: Self-pay | Admitting: Family Medicine

## 2017-05-31 DIAGNOSIS — I10 Essential (primary) hypertension: Secondary | ICD-10-CM

## 2017-06-29 ENCOUNTER — Other Ambulatory Visit: Payer: Self-pay | Admitting: Family Medicine

## 2017-06-29 DIAGNOSIS — E785 Hyperlipidemia, unspecified: Secondary | ICD-10-CM

## 2017-07-22 ENCOUNTER — Encounter: Payer: Self-pay | Admitting: Family Medicine

## 2017-07-22 ENCOUNTER — Ambulatory Visit: Payer: 59 | Admitting: Family Medicine

## 2017-07-22 VITALS — BP 132/76 | HR 56 | Temp 98.6°F | Resp 14 | Ht 73.0 in | Wt 242.0 lb

## 2017-07-22 DIAGNOSIS — J3489 Other specified disorders of nose and nasal sinuses: Secondary | ICD-10-CM

## 2017-07-22 DIAGNOSIS — R0981 Nasal congestion: Secondary | ICD-10-CM

## 2017-07-22 DIAGNOSIS — I10 Essential (primary) hypertension: Secondary | ICD-10-CM | POA: Diagnosis not present

## 2017-07-22 DIAGNOSIS — E7800 Pure hypercholesterolemia, unspecified: Secondary | ICD-10-CM | POA: Diagnosis not present

## 2017-07-22 MED ORDER — FLUTICASONE PROPIONATE 50 MCG/ACT NA SUSP: 2.0000 | Freq: Every day | NASAL | 2 refills | Status: DC

## 2017-07-22 NOTE — Progress Notes (Signed)
Name: Chad Hendricks   MRN: [id number]    DOB: [birth year]   Date:07/22/2017       Progress Note  Subjective  Chief Complaint  Chief Complaint  Patient presents with  . Follow-up    6 month  . Medication Refill    Hypertension  This is a chronic problem. The problem is unchanged. The problem is controlled. Pertinent negatives include no blurred vision, chest pain, headaches, palpitations or shortness of breath. Past treatments include ACE inhibitors. There is no history of kidney disease, CAD/MI or CVA.  Hyperlipidemia  This is a chronic problem. The problem is uncontrolled. Recent lipid tests were reviewed and are high. Pertinent negatives include no chest pain, leg pain, myalgias or shortness of breath. Current antihyperlipidemic treatment includes statins. Risk factors for coronary artery disease include dyslipidemia and male sex.   Pt. is also requesting refills for Flonase, he has symptoms of allergy with nasal congestion, rhinorrhea, stuffiness and sneezing. He has used Flonase in the past for similar symptoms with good efficacy.    Past Medical History:  Diagnosis Date  . Allergy   . Hyperlipidemia   . Hypertension   . Wears contact lenses     Past Surgical History:  Procedure Laterality Date  . COLONOSCOPY    . COLONOSCOPY WITH PROPOFOL N/A 07/20/2016   Procedure: COLONOSCOPY WITH PROPOFOL;  Surgeon: Lucilla Lame, MD;  Location: [place];  Service: Endoscopy;  Laterality: N/A;  . VASECTOMY      Family History  Problem Relation Age of Onset  . Cancer Mother   . Kidney disease Mother   . Kidney disease Father   . Prostate cancer Brother   . Prostate cancer Brother   . Kidney disease Brother   . Diabetes Brother   . Diabetes Brother   . Diabetes Brother   . Hypertension Maternal Grandmother   . Hypertension Maternal Grandfather   . Hypertension Paternal Grandmother   . Hypertension Paternal Grandfather     Social History   Socioeconomic History   . Marital status: Married    Spouse name: Not on file  . Number of children: Not on file  . Years of education: Not on file  . Highest education level: Not on file  Social Needs  . Financial resource strain: Not on file  . Food insecurity - worry: Not on file  . Food insecurity - inability: Not on file  . Transportation needs - medical: Not on file  . Transportation needs - non-medical: Not on file  Occupational History  . Not on file  Tobacco Use  . Smoking status: Never Smoker  . Smokeless tobacco: Never Used  Substance and Sexual Activity  . Alcohol use: Yes    Alcohol/week: 1.2 oz    Types: 2 Glasses of wine per week  . Drug use: No  . Sexual activity: Yes  Other Topics Concern  . Not on file  Social History Narrative  . Not on file     Current Outpatient Medications:  .  cetirizine (ZYRTEC) 10 MG tablet, Take by mouth., Disp: , Rfl:  .  Cholecalciferol (VITAMIN D3) 5000 units CAPS, Take 1 capsule by mouth daily., Disp: , Rfl:  .  co-enzyme Q-10 50 MG capsule, Take 50 mg by mouth daily., Disp: , Rfl:  .  DHA-EPA-VITAMIN E PO, Take 1 tablet by mouth daily. , Disp: , Rfl:  .  fluticasone (FLONASE) 50 MCG/ACT nasal spray, Place 2 sprays into both nostrils daily. , Disp: ,  Rfl:  .  lisinopril (PRINIVIL,ZESTRIL) 20 MG tablet, TAKE 1 TABLET BY MOUTH  DAILY, Disp: 90 tablet, Rfl: 1 .  lovastatin (MEVACOR) 40 MG tablet, TAKE 1 TABLET BY MOUTH AT  BEDTIME, Disp: 90 tablet, Rfl: 1 .  Multiple Vitamins-Minerals (MULTIVITAMIN ADULT PO), Take 1 capsule by mouth daily., Disp: , Rfl:  .  Omega-3 Fatty Acids (OMEGA 3 500) 500 MG CAPS, Take 500 mg by mouth daily., Disp: , Rfl:  .  Syringe, Disposable, (2-3CC SYRINGE) 3 ML MISC, Use 1 mL every 14 (fourteen) days., Disp: , Rfl:  .  testosterone cypionate (DEPOTESTOSTERONE CYPIONATE) 200 MG/ML injection, INJECT 1 ML IN MUSCLE Q 14 DAYS, Disp: , Rfl: 0 .  testosterone enanthate (DELATESTRYL) 200 MG/ML injection, Inject into the muscle.,  Disp: , Rfl:   Allergies  Allergen Reactions  . Latex Rash    Only if wears gloves for extended period     Review of Systems  Eyes: Negative for blurred vision.  Respiratory: Negative for shortness of breath.   Cardiovascular: Negative for chest pain and palpitations.  Musculoskeletal: Negative for myalgias.  Neurological: Negative for headaches.    Objective  Vitals:   07/22/17 0949  BP: 132/76  Pulse: (!) 56  Resp: 14  Temp: 98.6 F (37 C)  TempSrc: Oral  SpO2: 95%  Weight: 242 lb (109.8 kg)  Height: 6\' 1"  (1.854 m)    Physical Exam  Constitutional: He is oriented to person, place, and time and well-developed, well-nourished, and in no distress.  HENT:  Head: Normocephalic and atraumatic.  Nose: Right sinus exhibits no maxillary sinus tenderness and no frontal sinus tenderness. Left sinus exhibits no maxillary sinus tenderness and no frontal sinus tenderness.  Mouth/Throat: Oropharynx is clear and moist.  Nasal turbinate hypertrophy  Cardiovascular: Normal rate, regular rhythm and normal heart sounds.  No murmur heard. Pulmonary/Chest: Effort normal and breath sounds normal. He has no wheezes.  Abdominal: Soft. Bowel sounds are normal.  Musculoskeletal: He exhibits no edema.  Neurological: He is alert and oriented to person, place, and time.  Psychiatric: Mood, memory, affect and judgment normal.  Nursing note and vitals reviewed.      Assessment & Plan  1. Essential hypertension BP stable on present anti-hypertensive therapy.  2. Pure hypercholesterolemia  - Lipid panel  3. Nasal congestion with rhinorrhea Refill for Flonase provided - fluticasone (FLONASE) 50 MCG/ACT nasal spray; Place 2 sprays into both nostrils daily.  Dispense: 16 g; Refill: 2   Effie Janoski [facility] 07/22/2017 10:01 AM

## 2017-07-31 LAB — LIPID PANEL
Chol/HDL Ratio: 2.9 ratio (ref 0.0–5.0)
Cholesterol, Total: 203 mg/dL — ABNORMAL HIGH (ref 100–199)
HDL: 69 mg/dL (ref >39)
LDL CALC: 118 mg/dL — AB (ref 0–99)
Triglycerides: 79 mg/dL (ref 0–149)
VLDL CHOLESTEROL CAL: 16 mg/dL (ref 5–40)

## 2017-08-04 ENCOUNTER — Other Ambulatory Visit: Payer: Self-pay | Admitting: Family Medicine

## 2017-08-04 MED ORDER — ATORVASTATIN CALCIUM 40 MG PO TABS: 40.0000 mg | ORAL_TABLET | Freq: Every day | ORAL | 3 refills | Status: DC

## 2017-08-04 NOTE — Progress Notes (Signed)
Change statin 

## 2017-09-14 ENCOUNTER — Other Ambulatory Visit: Payer: Self-pay

## 2017-09-14 DIAGNOSIS — R0981 Nasal congestion: Secondary | ICD-10-CM

## 2017-09-14 DIAGNOSIS — J3489 Other specified disorders of nose and nasal sinuses: Secondary | ICD-10-CM

## 2017-09-14 MED ORDER — FLUTICASONE PROPIONATE 50 MCG/ACT NA SUSP: 2.0000 | Freq: Every day | NASAL | 5 refills | Status: DC

## 2017-09-27 ENCOUNTER — Telehealth: Payer: Self-pay | Admitting: Family Medicine

## 2017-09-27 NOTE — Telephone Encounter (Signed)
I personally called patient The lovastatin was auto-refill; showed fill on 09/24/17, and insurance notified me of two overlapping fills for statins He has been taking the newer statin, atorvastatin He will mail the lovastatin back when it comes

## 2017-10-19 ENCOUNTER — Encounter: Payer: Self-pay | Admitting: Family Medicine

## 2017-10-19 ENCOUNTER — Ambulatory Visit: Payer: Managed Care, Other (non HMO) | Admitting: Family Medicine

## 2017-10-19 DIAGNOSIS — E669 Obesity, unspecified: Secondary | ICD-10-CM | POA: Insufficient documentation

## 2017-10-19 DIAGNOSIS — Z6832 Body mass index (BMI) 32.0-32.9, adult: Secondary | ICD-10-CM | POA: Insufficient documentation

## 2017-10-19 DIAGNOSIS — E349 Endocrine disorder, unspecified: Secondary | ICD-10-CM

## 2017-10-19 DIAGNOSIS — E7800 Pure hypercholesterolemia, unspecified: Secondary | ICD-10-CM | POA: Diagnosis not present

## 2017-10-19 DIAGNOSIS — I10 Essential (primary) hypertension: Secondary | ICD-10-CM | POA: Diagnosis not present

## 2017-10-19 DIAGNOSIS — Z5181 Encounter for therapeutic drug level monitoring: Secondary | ICD-10-CM | POA: Insufficient documentation

## 2017-10-19 NOTE — Assessment & Plan Note (Signed)
See AVS

## 2017-10-19 NOTE — Assessment & Plan Note (Signed)
Check SGPT, Cr, and K+

## 2017-10-19 NOTE — Assessment & Plan Note (Signed)
Managed by urologist 

## 2017-10-19 NOTE — Assessment & Plan Note (Signed)
See what chol is on the new medicine; try limit saturated fats

## 2017-10-19 NOTE — Assessment & Plan Note (Addendum)
See AVS; he has a very muscular build and likely has only a few pounds to lose

## 2017-10-19 NOTE — Assessment & Plan Note (Addendum)
Try DASH guidelines; continue medicine; check Cr and K+

## 2017-10-19 NOTE — Patient Instructions (Addendum)
Try to follow the DASH guidelines (DASH stands for Dietary Approaches to Stop Hypertension). Try to limit the sodium in your diet to no more than 1,500mg  of sodium per day. Certainly try to not exceed 2,000 mg per day at the very most. Do not add salt when cooking or at the table.  Check the sodium amount on labels when shopping, and choose items lower in sodium when given a choice. Avoid or limit foods that already contain a lot of sodium. Eat a diet rich in fruits and vegetables and whole grains, and try to lose weight if overweight or obese  Check out the information at familydoctor.org entitled "Nutrition for Weight Loss: What You Need to Know about Fad Diets" Try to lose between 1-2 pounds per week by taking in fewer calories and burning off more calories You can succeed by limiting portions, limiting foods dense in calories and fat, becoming more active, and drinking 8 glasses of water a day (64 ounces) Don't skip meals, especially breakfast, as skipping meals may alter your metabolism Do not use over-the-counter weight loss pills or gimmicks that claim rapid weight loss A healthy BMI (or body mass index) is between 18.5 and 24.9 You can calculate your ideal BMI at the [place] website ClubMonetize.fr  Try to use PLAIN allergy medicine without the decongestant Avoid: phenylephrine, phenylpropanolamine, and pseudoephredine   Obesity, Adult Obesity is the condition of having too much total body fat. Being overweight or obese means that your weight is greater than what is considered healthy for your body size. Obesity is determined by a measurement called BMI. BMI is an estimate of body fat and is calculated from height and weight. For adults, a BMI of 30 or higher is considered obese. Obesity can eventually lead to other health concerns and major illnesses, including:  Stroke.  Coronary artery disease (CAD).  Type 2 diabetes.  Some types of  cancer, including cancers of the colon, breast, uterus, and gallbladder.  Osteoarthritis.  High blood pressure (hypertension).  High cholesterol.  Sleep apnea.  Gallbladder stones.  Infertility problems.  What are the causes? The main cause of obesity is taking in (consuming) more calories than your body uses for energy. Other factors that contribute to this condition may include:  Being born with genes that make you more likely to become obese.  Having a medical condition that causes obesity. These conditions include: ? Hypothyroidism. ? Polycystic ovarian syndrome (PCOS). ? Binge-eating disorder. ? Cushing syndrome.  Taking certain medicines, such as steroids, antidepressants, and seizure medicines.  Not being physically active (sedentary lifestyle).  Living where there are limited places to exercise safely or buy healthy foods.  Not getting enough sleep.  What increases the risk? The following factors may increase your risk of this condition:  Having a family history of obesity.  Being a woman of African-American descent.  Being a man of Hispanic descent.  What are the signs or symptoms? Having excessive body fat is the main symptom of this condition. How is this diagnosed? This condition may be diagnosed based on:  Your symptoms.  Your medical history.  A physical exam. Your health care provider may measure: ? Your BMI. If you are an adult with a BMI between 25 and less than 30, you are considered overweight. If you are an adult with a BMI of 30 or higher, you are considered obese. ? The distances around your hips and your waist (circumferences). These may be compared to each other to help diagnose  your condition. ? Your skinfold thickness. Your health care provider may gently pinch a fold of your skin and measure it.  How is this treated? Treatment for this condition often includes changing your lifestyle. Treatment may include some or all of the  following:  Dietary changes. Work with your health care provider and a dietitian to set a weight-loss goal that is healthy and reasonable for you. Dietary changes may include eating: ? Smaller portions. A portion size is the amount of a particular food that is healthy for you to eat at one time. This varies from person to person. ? Low-calorie or low-fat options. ? More whole grains, fruits, and vegetables.  Regular physical activity. This may include aerobic activity (cardio) and strength training.  Medicine to help you lose weight. Your health care provider may prescribe medicine if you are unable to lose 1 pound a week after 6 weeks of eating more healthily and doing more physical activity.  Surgery. Surgical options may include gastric banding and gastric bypass. Surgery may be done if: ? Other treatments have not helped to improve your condition. ? You have a BMI of 40 or higher. ? You have life-threatening health problems related to obesity.  Follow these instructions at home:  Eating and drinking   Follow recommendations from your health care provider about what you eat and drink. Your health care provider may advise you to: ? Limit fast foods, sweets, and processed snack foods. ? Choose low-fat options, such as low-fat milk instead of whole milk. ? Eat 5 or more servings of fruits or vegetables every day. ? Eat at home more often. This gives you more control over what you eat. ? Choose healthy foods when you eat out. ? Learn what a healthy portion size is. ? Keep low-fat snacks on hand. ? Avoid sugary drinks, such as soda, fruit juice, iced tea sweetened with sugar, and flavored milk. ? Eat a healthy breakfast.  Drink enough water to keep your urine clear or pale yellow.  Do not go without eating for long periods of time (do not fast) or follow a fad diet. Fasting and fad diets can be unhealthy and even dangerous. Physical Activity  Exercise regularly, as told by your  health care provider. Ask your health care provider what types of exercise are safe for you and how often you should exercise.  Warm up and stretch before being active.  Cool down and stretch after being active.  Rest between periods of activity. Lifestyle  Limit the time that you spend in front of your TV, computer, or video game system.  Find ways to reward yourself that do not involve food.  Limit alcohol intake to no more than 1 drink a day for nonpregnant women and 2 drinks a day for men. One drink equals 12 oz of beer, 5 oz of wine, or 1 oz of hard liquor. General instructions  Keep a weight loss journal to keep track of the food you eat and how much you exercise you get.  Take over-the-counter and prescription medicines only as told by your health care provider.  Take vitamins and supplements only as told by your health care provider.  Consider joining a support group. Your health care provider may be able to recommend a support group.  Keep all follow-up visits as told by your health care provider. This is important. Contact a health care provider if:  You are unable to meet your weight loss goal after 6 weeks of dietary  and lifestyle changes. This information is not intended to replace advice given to you by your health care provider. Make sure you discuss any questions you have with your health care provider. Document Released: 07/02/2004 Document Revised: 10/28/2015 Document Reviewed: 03/13/2015 Elsevier Interactive Patient Education  2018 [place] Eating Plan DASH stands for "Dietary Approaches to Stop Hypertension." The DASH eating plan is a healthy eating plan that has been shown to reduce high blood pressure (hypertension). It may also reduce your risk for type 2 diabetes, heart disease, and stroke. The DASH eating plan may also help with weight loss. What are tips for following this plan? General guidelines  Avoid eating more than 2,300 mg  (milligrams) of salt (sodium) a day. If you have hypertension, you may need to reduce your sodium intake to 1,500 mg a day.  Limit alcohol intake to no more than 1 drink a day for nonpregnant women and 2 drinks a day for men. One drink equals 12 oz of beer, 5 oz of wine, or 1 oz of hard liquor.  Work with your health care provider to maintain a healthy body weight or to lose weight. Ask what an ideal weight is for you.  Get at least 30 minutes of exercise that causes your heart to beat faster (aerobic exercise) most days of the week. Activities may include walking, swimming, or biking.  Work with your health care provider or diet and nutrition specialist (dietitian) to adjust your eating plan to your individual calorie needs. Reading food labels  Check food labels for the amount of sodium per serving. Choose foods with less than 5 percent of the Daily Value of sodium. Generally, foods with less than 300 mg of sodium per serving fit into this eating plan.  To find whole grains, look for the word "whole" as the first word in the ingredient list. Shopping  Buy products labeled as "low-sodium" or "no salt added."  Buy fresh foods. Avoid canned foods and premade or frozen meals. Cooking  Avoid adding salt when cooking. Use salt-free seasonings or herbs instead of table salt or sea salt. Check with your health care provider or pharmacist before using salt substitutes.  Do not fry foods. Cook foods using healthy methods such as baking, boiling, grilling, and broiling instead.  Cook with heart-healthy oils, such as olive, canola, soybean, or sunflower oil. Meal planning   Eat a balanced diet that includes: ? 5 or more servings of fruits and vegetables each day. At each meal, try to fill half of your plate with fruits and vegetables. ? Up to 6-8 servings of whole grains each day. ? Less than 6 oz of lean meat, poultry, or fish each day. A 3-oz serving of meat is about the same size as a deck  of cards. One egg equals 1 oz. ? 2 servings of low-fat dairy each day. ? A serving of nuts, seeds, or beans 5 times each week. ? Heart-healthy fats. Healthy fats called Omega-3 fatty acids are found in foods such as flaxseeds and coldwater fish, like sardines, salmon, and mackerel.  Limit how much you eat of the following: ? Canned or prepackaged foods. ? Food that is high in trans fat, such as fried foods. ? Food that is high in saturated fat, such as fatty meat. ? Sweets, desserts, sugary drinks, and other foods with added sugar. ? Full-fat dairy products.  Do not salt foods before eating.  Try to eat at least 2 vegetarian meals each week.  Eat more home-cooked food and less restaurant, buffet, and fast food.  When eating at a restaurant, ask that your food be prepared with less salt or no salt, if possible. What foods are recommended? The items listed may not be a complete list. Talk with your dietitian about what dietary choices are best for you. Grains Whole-grain or whole-wheat bread. Whole-grain or whole-wheat pasta. Brown rice. [place]. Bulgur. Whole-grain and low-sodium cereals. Pita bread. Low-fat, low-sodium crackers. Whole-wheat flour tortillas. Vegetables Fresh or frozen vegetables (raw, steamed, roasted, or grilled). Low-sodium or reduced-sodium tomato and vegetable juice. Low-sodium or reduced-sodium tomato sauce and tomato paste. Low-sodium or reduced-sodium canned vegetables. Fruits All fresh, dried, or frozen fruit. Canned fruit in natural juice (without added sugar). Meat and other protein foods Skinless chicken or [place]. Ground chicken or [place]. Pork with fat trimmed off. Fish and seafood. Egg whites. Dried beans, peas, or lentils. Unsalted nuts, nut butters, and seeds. Unsalted canned beans. Lean cuts of beef with fat trimmed off. Low-sodium, lean deli meat. Dairy Low-fat (1%) or fat-free (skim) milk. Fat-free, low-fat, or reduced-fat cheeses. Nonfat,  low-sodium ricotta or cottage cheese. Low-fat or nonfat yogurt. Low-fat, low-sodium cheese. Fats and oils Soft margarine without trans fats. Vegetable oil. Low-fat, reduced-fat, or light mayonnaise and salad dressings (reduced-sodium). Canola, safflower, olive, soybean, and sunflower oils. Avocado. Seasoning and other foods Herbs. Spices. Seasoning mixes without salt. Unsalted popcorn and pretzels. Fat-free sweets. What foods are not recommended? The items listed may not be a complete list. Talk with your dietitian about what dietary choices are best for you. Grains Baked goods made with fat, such as croissants, muffins, or some breads. Dry pasta or rice meal packs. Vegetables Creamed or fried vegetables. Vegetables in a cheese sauce. Regular canned vegetables (not low-sodium or reduced-sodium). Regular canned tomato sauce and paste (not low-sodium or reduced-sodium). Regular tomato and vegetable juice (not low-sodium or reduced-sodium). Angie Fava. Olives. Fruits Canned fruit in a light or heavy syrup. Fried fruit. Fruit in cream or butter sauce. Meat and other protein foods Fatty cuts of meat. Ribs. Fried meat. Berniece Salines. Sausage. Bologna and other processed lunch meats. Salami. Fatback. Hotdogs. Bratwurst. Salted nuts and seeds. Canned beans with added salt. Canned or smoked fish. Whole eggs or egg yolks. Chicken or [place] with skin. Dairy Whole or 2% milk, cream, and half-and-half. Whole or full-fat cream cheese. Whole-fat or sweetened yogurt. Full-fat cheese. Nondairy creamers. Whipped toppings. Processed cheese and cheese spreads. Fats and oils Butter. Stick margarine. Lard. Shortening. Ghee. Bacon fat. Tropical oils, such as coconut, palm kernel, or palm oil. Seasoning and other foods Salted popcorn and pretzels. Onion salt, garlic salt, seasoned salt, table salt, and sea salt. Worcestershire sauce. Tartar sauce. Barbecue sauce. Teriyaki sauce. Soy sauce, including reduced-sodium. Steak sauce.  Canned and packaged gravies. Fish sauce. Oyster sauce. Cocktail sauce. Horseradish that you find on the shelf. Ketchup. Mustard. Meat flavorings and tenderizers. Bouillon cubes. Hot sauce and Tabasco sauce. Premade or packaged marinades. Premade or packaged taco seasonings. Relishes. Regular salad dressings. Where to find more information:  National Heart, Lung, and [place]: https://wilson-eaton.com/  American Heart Association: www.heart.org Summary  The DASH eating plan is a healthy eating plan that has been shown to reduce high blood pressure (hypertension). It may also reduce your risk for type 2 diabetes, heart disease, and stroke.  With the DASH eating plan, you should limit salt (sodium) intake to 2,300 mg a day. If you have hypertension, you may need to reduce your sodium intake  to 1,500 mg a day.  When on the DASH eating plan, aim to eat more fresh fruits and vegetables, whole grains, lean proteins, low-fat dairy, and heart-healthy fats.  Work with your health care provider or diet and nutrition specialist (dietitian) to adjust your eating plan to your individual calorie needs. This information is not intended to replace advice given to you by your health care provider. Make sure you discuss any questions you have with your health care provider. Document Released: 05/14/2011 Document Revised: 05/18/2016 Document Reviewed: 05/18/2016 Elsevier Interactive Patient Education  [employer].

## 2017-10-19 NOTE — Progress Notes (Signed)
BP 136/84   Pulse (!) 57   Temp 98.2 F (36.8 C) (Oral)   Resp 14   Ht 6\' 1"  (1.854 m)   Wt 244 lb 1.6 oz (110.7 kg)   SpO2 96%   BMI 32.21 kg/m    Subjective:    Patient ID: Chad Hendricks, male    DOB: 11/22/1965, 52 y.o.   MRN: [id number]  HPI: Chad Hendricks is a 52 y.o. male  Chief Complaint  Patient presents with  . Follow-up    Former [facility] pt  . Medication Refill    HPI  Patient is new to me; previous provider left our practice  HTN; not adding salt to food; diagnosed around 1997; runs in the family; on lisinopril; used to be on HCTZ; stopped that, too low; no chest pain  Allergies; getting better now; worse a few weeks ago; plain antihistamines  High cholesterol; runs in the family; trying to avoid fatty meats; trying to get whole grains; wasn't on the lipitor than; now will be on atorv instead of lovastatin; also on omega-3 Lab Results  Component Value Date   CHOL 203 (H) 07/30/2017   HDL 69 07/30/2017   LDLCALC 118 (H) 07/30/2017   TRIG 79 07/30/2017   CHOLHDL 2.9 07/30/2017   Obesity; muscular build, but doctor stopped the testosterone supplement, so more tired  Low testosterone; was seeing urologist; strong family hx of prostate cancer; stopped the testosterone  Kidney issues in the family; both parents; no NSAIDs; noticed slight increase in Cr over last 3 draws  He read about zinc, to help decrease duration of a cold  Depression screen [facility] H 2/9 10/19/2017 07/22/2017 02/03/2017 06/21/2015 12/17/2014  Decreased Interest 0 0 0 0 0  Down, Depressed, Hopeless 0 0 0 0 0  PHQ - 2 Score 0 0 0 0 0    Relevant past medical, surgical, family and social history reviewed Past Medical History:  Diagnosis Date  . Allergy   . Hyperlipidemia   . Hypertension   . Wears contact lenses    Past Surgical History:  Procedure Laterality Date  . COLONOSCOPY    . COLONOSCOPY WITH PROPOFOL N/A 07/20/2016   Procedure: COLONOSCOPY WITH PROPOFOL;  Surgeon: Lucilla Lame, MD;  Location: [place];  Service: Endoscopy;  Laterality: N/A;  . VASECTOMY     Family History  Problem Relation Age of Onset  . Cancer Mother   . Kidney disease Mother   . Kidney disease Father   . Prostate cancer Brother   . Prostate cancer Brother   . Kidney disease Brother   . Diabetes Brother   . Diabetes Brother   . Diabetes Brother   . Hypertension Maternal Grandmother   . Hypertension Maternal Grandfather   . Hypertension Paternal Grandmother   . Hypertension Paternal Grandfather    Social History   Tobacco Use  . Smoking status: Never Smoker  . Smokeless tobacco: Never Used  Substance Use Topics  . Alcohol use: Yes    Alcohol/week: 1.2 oz    Types: 2 Glasses of wine per week  . Drug use: No    Interim medical history since last visit reviewed. Allergies and medications reviewed  Review of Systems  Constitutional: Negative for unexpected weight change.  Cardiovascular: Negative for chest pain and leg swelling.   Per HPI unless specifically indicated above     Objective:    BP 136/84   Pulse (!) 57   Temp 98.2 F (36.8 C) (  Oral)   Resp 14   Ht 6\' 1"  (1.854 m)   Wt 244 lb 1.6 oz (110.7 kg)   SpO2 96%   BMI 32.21 kg/m   Wt Readings from Last 3 Encounters:  10/19/17 244 lb 1.6 oz (110.7 kg)  07/22/17 242 lb (109.8 kg)  02/03/17 241 lb 14.4 oz (109.7 kg)    Physical Exam  Constitutional: He appears well-developed and well-nourished. No distress.  Muscular build  HENT:  Head: Normocephalic and atraumatic.  Eyes: EOM are normal. No scleral icterus.  Neck: No thyromegaly present.  Cardiovascular: Regular rhythm. Bradycardia present.  Pulmonary/Chest: Effort normal and breath sounds normal.  Abdominal: Soft. Bowel sounds are normal. He exhibits no distension.  Musculoskeletal: He exhibits no edema.  Neurological: He is alert.  Skin: Skin is warm and dry. No pallor.  Psychiatric: He has a normal mood and affect. His behavior is  normal. Judgment and thought content normal.    Results for orders placed or performed in visit on 07/22/17  Lipid panel  Result Value Ref Range   Cholesterol, Total 203 (H) 100 - 199 mg/dL   Triglycerides 79 0 - 149 mg/dL   HDL 69 >39 mg/dL   VLDL Cholesterol Cal 16 5 - 40 mg/dL   LDL Calculated 118 (H) 0 - 99 mg/dL   Chol/HDL Ratio 2.9 0.0 - 5.0 ratio      Assessment & Plan:   Problem List Items Addressed This Visit      Cardiovascular and Mediastinum   Essential hypertension, benign    Try DASH guidelines; continue medicine; check Cr and K+        Other   Obesity (BMI 30.0-34.9) (Chronic)    See AVS; he has a very muscular build and likely has only a few pounds to lose      Medication monitoring encounter    Check SGPT, Cr, and K+      Relevant Orders   Comprehensive metabolic panel   Hypotestosteronism    Managed by urologist      Hyperlipidemia (Chronic)    See what chol is on the new medicine; try limit saturated fats      Relevant Orders   Lipid panel   BMI 32.0-32.9,adult    See AVS          Follow up plan: Return in about 6 months (around 04/21/2018) for follow-up visit with Dr. Sanda Klein.  An after-visit summary was printed and given to the patient at [facility].  Please see the patient instructions which may contain other information and recommendations beyond what is mentioned above in the assessment and plan.  No orders of the defined types were placed in this encounter.   Orders Placed This Encounter  Procedures  . Comprehensive metabolic panel  . Lipid panel

## 2017-10-23 LAB — COMPREHENSIVE METABOLIC PANEL
ALBUMIN: 4.4 g/dL (ref 3.5–5.5)
ALT: 48 IU/L — ABNORMAL HIGH (ref 0–44)
AST: 36 IU/L (ref 0–40)
Albumin/Globulin Ratio: 1.7 (ref 1.2–2.2)
Alkaline Phosphatase: 53 IU/L (ref 39–117)
BUN / CREAT RATIO: 12 (ref 9–20)
BUN: 16 mg/dL (ref 6–24)
Bilirubin Total: 0.6 mg/dL (ref 0.0–1.2)
CALCIUM: 9.7 mg/dL (ref 8.7–10.2)
CO2: 25 mmol/L (ref 20–29)
CREATININE: 1.37 mg/dL — AB (ref 0.76–1.27)
Chloride: 100 mmol/L (ref 96–106)
GFR, EST AFRICAN AMERICAN: 69 mL/min/{1.73_m2} (ref >59)
GFR, EST NON AFRICAN AMERICAN: 59 mL/min/{1.73_m2} — AB (ref >59)
GLOBULIN, TOTAL: 2.6 g/dL (ref 1.5–4.5)
GLUCOSE: 83 mg/dL (ref 65–99)
Potassium: 4.8 mmol/L (ref 3.5–5.2)
SODIUM: 139 mmol/L (ref 134–144)
TOTAL PROTEIN: 7 g/dL (ref 6.0–8.5)

## 2017-10-23 LAB — LIPID PANEL
CHOL/HDL RATIO: 3 ratio (ref 0.0–5.0)
Cholesterol, Total: 186 mg/dL (ref 100–199)
HDL: 63 mg/dL (ref >39)
LDL CALC: 109 mg/dL — AB (ref 0–99)
Triglycerides: 70 mg/dL (ref 0–149)
VLDL CHOLESTEROL CAL: 14 mg/dL (ref 5–40)

## 2017-10-25 ENCOUNTER — Other Ambulatory Visit: Payer: Self-pay | Admitting: Family Medicine

## 2017-10-25 DIAGNOSIS — I10 Essential (primary) hypertension: Secondary | ICD-10-CM

## 2017-10-25 MED ORDER — LISINOPRIL 20 MG PO TABS: 20.0000 mg | ORAL_TABLET | Freq: Every day | ORAL | 1 refills | Status: DC

## 2017-10-25 NOTE — Progress Notes (Signed)
Refilled ACE-I

## 2017-12-06 ENCOUNTER — Other Ambulatory Visit: Payer: Self-pay

## 2017-12-06 MED ORDER — LOVASTATIN 40 MG PO TABS: 40.0000 mg | ORAL_TABLET | Freq: Every day | ORAL | 1 refills | Status: DC

## 2017-12-06 NOTE — Telephone Encounter (Signed)
Refill request was sent to Dr. Melinda Lada for approval and submission.  

## 2017-12-08 IMAGING — US US ABDOMEN LIMITED
1 series · 6 of 6 positions shown · non-contrast
Comparison: None.

CLINICAL DATA: Left-sided pain 3 months mainly with cough and
sneezing.

EXAM:
LIMITED ABDOMINAL ULTRASOUND OVER THE LEFT UPPER QUADRANT TO
EVALUATE THE SPLEEN.

[Series 1: us abdomen limited · 0.26mm/px · 6 of 6 slices shown]
[im 1/6]
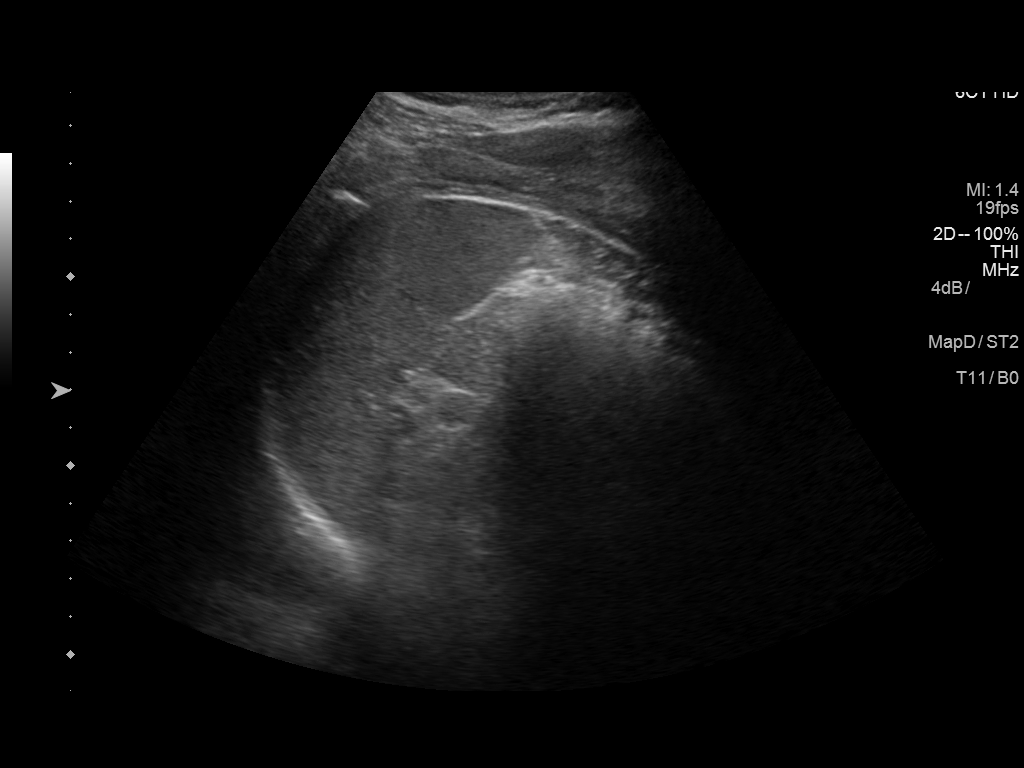
[im 2/6]
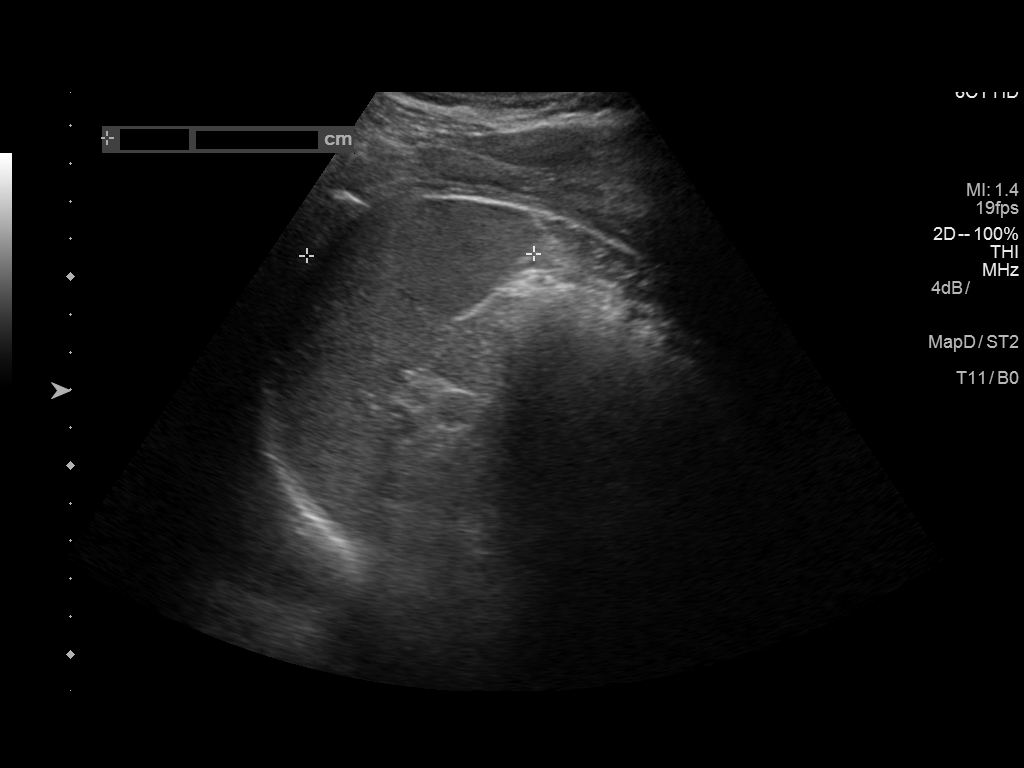
[im 3/6]
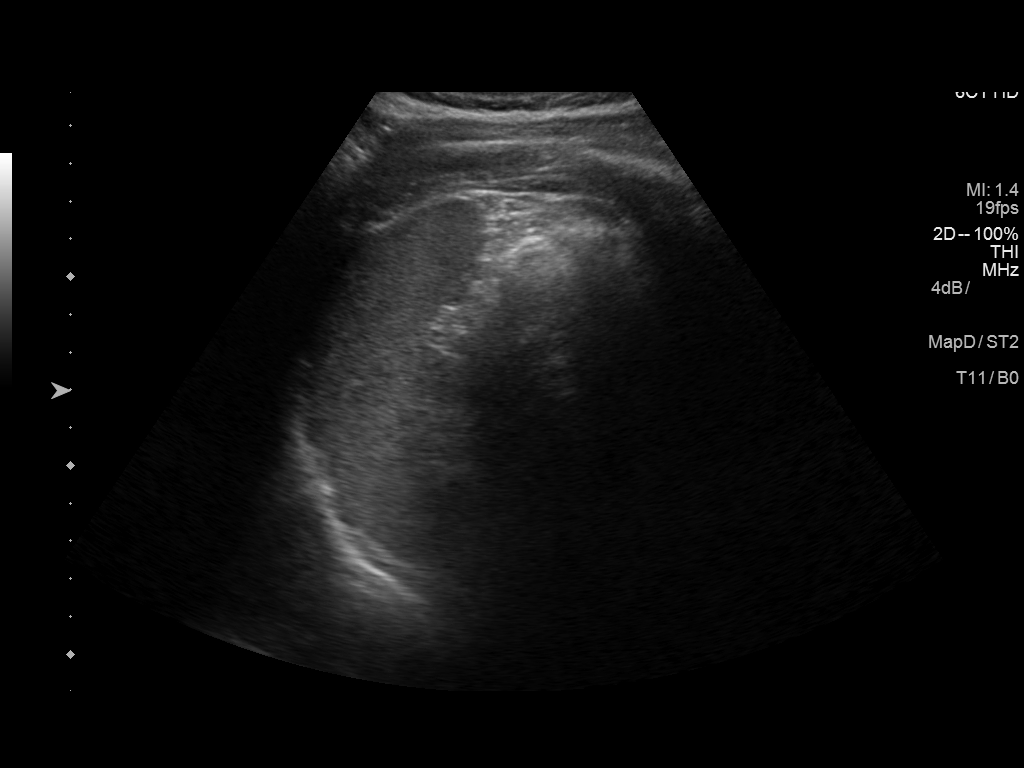
[im 4/6]
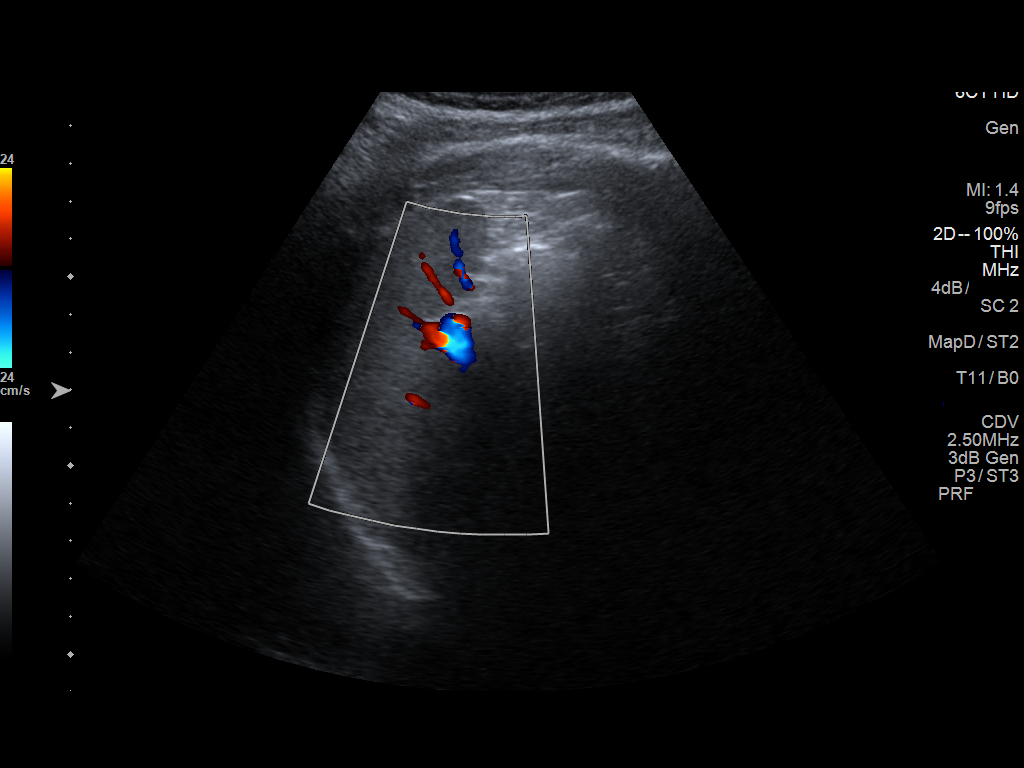
[im 5/6]
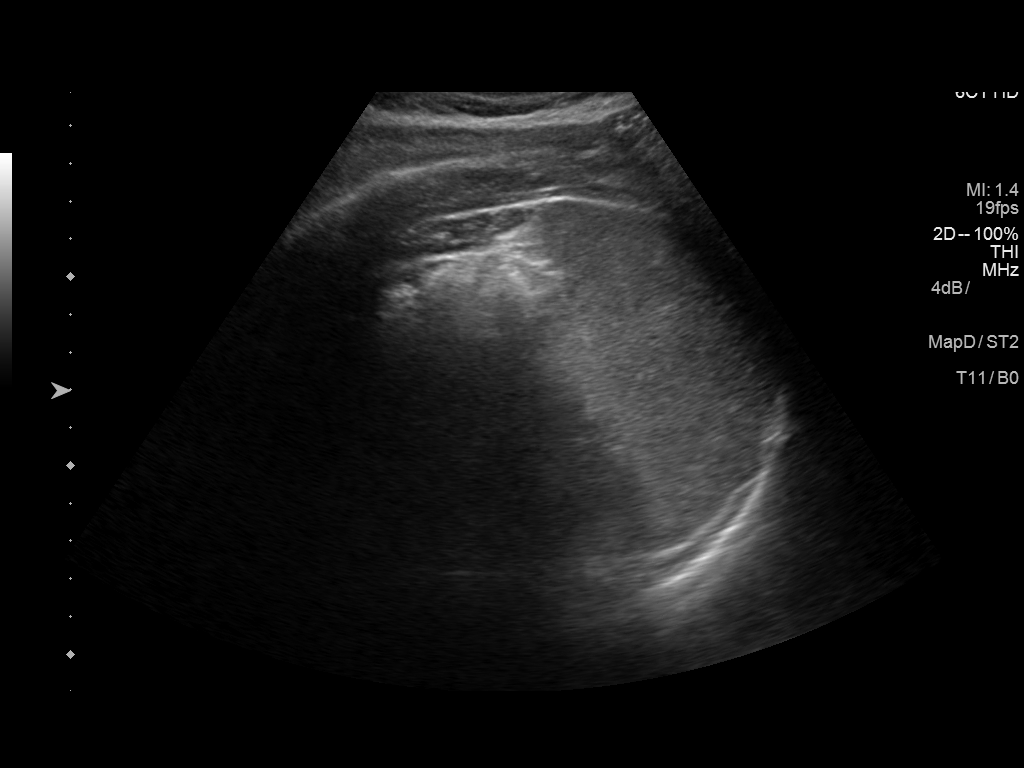
[im 6/6]
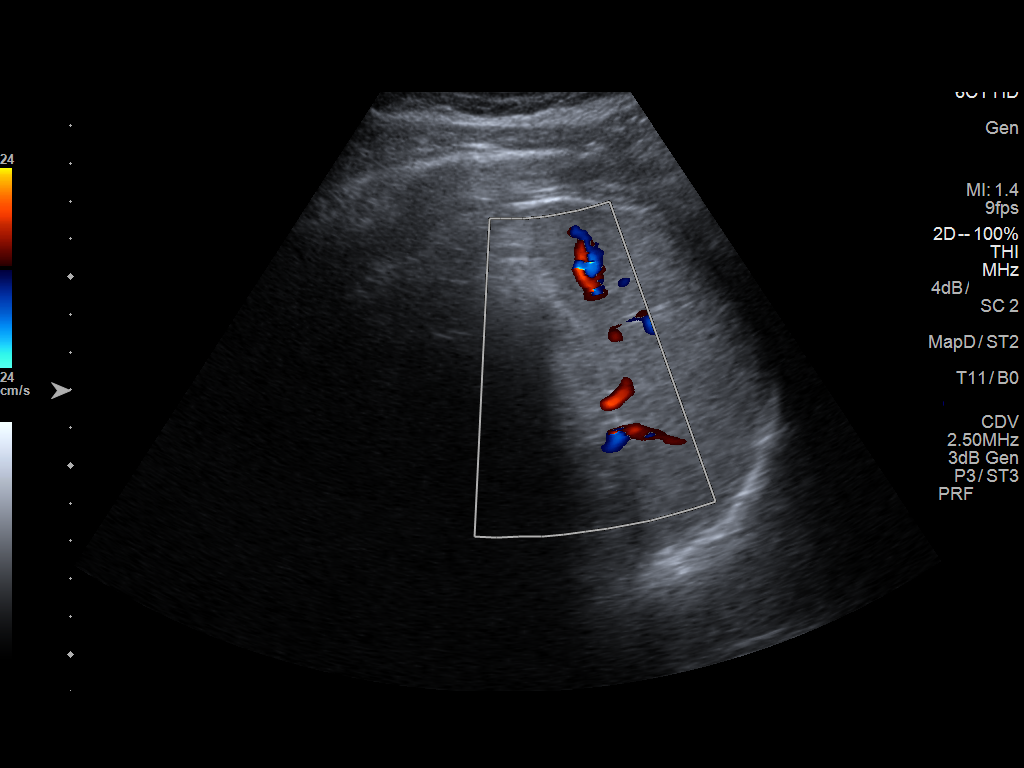

[6 of 6 positions shown; findings below may reference images not displayed]

FINDINGS: Spleen is normal size, shape and position measuring 9.7 cm in
greatest diameter. No focal mass within the spleen. No focal
abnormality within the visualized left upper quadrant. No free
fluid.
IMPRESSION: Normal left upper quadrant ultrasound and normal spleen.

## 2018-02-02 ENCOUNTER — Other Ambulatory Visit: Payer: Self-pay

## 2018-02-02 MED ORDER — ATORVASTATIN CALCIUM 40 MG PO TABS: 40.0000 mg | ORAL_TABLET | Freq: Every day | ORAL | 3 refills | Status: DC

## 2018-02-02 NOTE — Telephone Encounter (Signed)
New pharmacy

## 2018-03-29 ENCOUNTER — Other Ambulatory Visit: Payer: Self-pay

## 2018-04-04 ENCOUNTER — Other Ambulatory Visit: Payer: Self-pay

## 2018-04-04 DIAGNOSIS — I10 Essential (primary) hypertension: Secondary | ICD-10-CM

## 2018-04-04 MED ORDER — LISINOPRIL 20 MG PO TABS: 20.0000 mg | ORAL_TABLET | Freq: Every day | ORAL | 1 refills | Status: DC

## 2018-04-19 ENCOUNTER — Ambulatory Visit: Payer: Managed Care, Other (non HMO) | Admitting: Family Medicine

## 2018-04-19 ENCOUNTER — Encounter: Payer: Self-pay | Admitting: Family Medicine

## 2018-04-19 VITALS — BP 138/84 | HR 85 | Temp 98.2°F | Ht 73.0 in | Wt 265.3 lb

## 2018-04-19 DIAGNOSIS — Z23 Encounter for immunization: Secondary | ICD-10-CM

## 2018-04-19 DIAGNOSIS — I10 Essential (primary) hypertension: Secondary | ICD-10-CM

## 2018-04-19 DIAGNOSIS — E782 Mixed hyperlipidemia: Secondary | ICD-10-CM | POA: Diagnosis not present

## 2018-04-19 DIAGNOSIS — N289 Disorder of kidney and ureter, unspecified: Secondary | ICD-10-CM

## 2018-04-19 DIAGNOSIS — Z6835 Body mass index (BMI) 35.0-35.9, adult: Secondary | ICD-10-CM | POA: Insufficient documentation

## 2018-04-19 DIAGNOSIS — Z5181 Encounter for therapeutic drug level monitoring: Secondary | ICD-10-CM | POA: Diagnosis not present

## 2018-04-19 NOTE — Assessment & Plan Note (Signed)
Check liver and kidneys 

## 2018-04-19 NOTE — Progress Notes (Signed)
BP 138/84   Pulse 85   Temp 98.2 F (36.8 C)   Ht 6\' 1"  (1.854 m)   Wt 265 lb 4.8 oz (120.3 kg)   SpO2 99%   BMI 35.00 kg/m    Subjective:    Patient ID: Chad Hendricks, male    DOB: [birth year], 52 y.o.   MRN: [id number]  HPI: Chad Hendricks is a 52 y.o. male  Chief Complaint  Patient presents with  . Follow-up    HPI Patient is here for f/u  HTN; on lisinopril; high at first; 130/74 or so away from the doctor; taking decongestants; some soup too; [employer] has soup with less sodium, know to watch; taking every day; HTN runs in the family  High cholesterol; on 40 mg atorvastatin; no muscle aches; limiting fatty meats; not much cheese, can't do itgetting whole grains Lab Results  Component Value Date   CHOL 186 10/22/2017   HDL 63 10/22/2017   LDLCALC 109 (H) 10/22/2017   TRIG 70 10/22/2017   CHOLHDL 3.0 10/22/2017   Would like a flu shot today but getting over something  Obesity; he has gained significant weight since the last visit but he has started lifting weights with his son; putting on muscle mass; has more energy; can feel extra weight on the back; no extra belly fat; all in the legs and arms and chest; he has been using creatine  His allergies were not too bad this fall  Depression screen [facility] 2/9 04/19/2018 10/19/2017 07/22/2017 02/03/2017 06/21/2015  Decreased Interest 0 0 0 0 0  Down, Depressed, Hopeless 0 0 0 0 0  PHQ - 2 Score 0 0 0 0 0  Altered sleeping 0 - - - -  Tired, decreased energy 0 - - - -  Change in appetite 0 - - - -  Feeling bad or failure about yourself  0 - - - -  Trouble concentrating 0 - - - -  Moving slowly or fidgety/restless 0 - - - -  Suicidal thoughts 0 - - - -  PHQ-9 Score 0 - - - -  Difficult doing work/chores Not difficult at all - - - -   Fall Risk  04/19/2018 10/19/2017 07/22/2017 02/03/2017 06/21/2015  Falls in the past year? 0 No No No No    Relevant past medical, surgical, family and social history reviewed Past  Medical History:  Diagnosis Date  . Allergy   . Hyperlipidemia   . Hypertension   . Wears contact lenses    Past Surgical History:  Procedure Laterality Date  . COLONOSCOPY    . COLONOSCOPY WITH PROPOFOL N/A 07/20/2016   Procedure: COLONOSCOPY WITH PROPOFOL;  Surgeon: Lucilla Lame, MD;  Location: [place];  Service: Endoscopy;  Laterality: N/A;  . VASECTOMY     Family History  Problem Relation Age of Onset  . Cancer Mother   . Kidney disease Mother   . Kidney disease Father   . Prostate cancer Brother   . Prostate cancer Brother   . Kidney disease Brother   . Diabetes Brother   . Diabetes Brother   . Diabetes Brother   . Hypertension Maternal Grandmother   . Hypertension Maternal Grandfather   . Hypertension Paternal Grandmother   . Hypertension Paternal Grandfather    Social History   Tobacco Use  . Smoking status: Never Smoker  . Smokeless tobacco: Never Used  Substance Use Topics  . Alcohol use: Yes    Alcohol/week: 2.0 standard  drinks    Types: 2 Glasses of wine per week  . Drug use: No     Office Visit from 04/19/2018 in [facility]  AUDIT-C Score  0     Interim medical history since last visit reviewed. Allergies and medications reviewed  Review of Systems Per HPI unless specifically indicated above     Objective:    BP 138/84   Pulse 85   Temp 98.2 F (36.8 C)   Ht 6\' 1"  (1.854 m)   Wt 265 lb 4.8 oz (120.3 kg)   SpO2 99%   BMI 35.00 kg/m   Wt Readings from Last 3 Encounters:  04/19/18 265 lb 4.8 oz (120.3 kg)  10/19/17 244 lb 1.6 oz (110.7 kg)  07/22/17 242 lb (109.8 kg)    Physical Exam  Constitutional: He appears well-developed and well-nourished. No distress.  HENT:  Head: Normocephalic and atraumatic.  Eyes: EOM are normal. No scleral icterus.  Neck: No thyromegaly present.  Cardiovascular: Normal rate and regular rhythm.  Pulmonary/Chest: Effort normal and breath sounds normal.  Abdominal: Soft. Bowel  sounds are normal. He exhibits no distension.  Musculoskeletal: He exhibits no edema.  Very muscular build consistent with bodybuilding/weight lifting  Neurological: Coordination normal.  Skin: Skin is warm and dry. No pallor.  Psychiatric: He has a normal mood and affect. His behavior is normal. Judgment and thought content normal. His mood appears not anxious. He does not exhibit a depressed mood.   Results for orders placed or performed in visit on 10/19/17  Comprehensive metabolic panel  Result Value Ref Range   Glucose 83 65 - 99 mg/dL   BUN 16 6 - 24 mg/dL   Creatinine, Ser 1.37 (H) 0.76 - 1.27 mg/dL   GFR calc non Af Amer 59 (L) >59 mL/min/1.73   GFR calc Af Amer 69 >59 mL/min/1.73   BUN/Creatinine Ratio 12 9 - 20   Sodium 139 134 - 144 mmol/L   Potassium 4.8 3.5 - 5.2 mmol/L   Chloride 100 96 - 106 mmol/L   CO2 25 20 - 29 mmol/L   Calcium 9.7 8.7 - 10.2 mg/dL   Total Protein 7.0 6.0 - 8.5 g/dL   Albumin 4.4 3.5 - 5.5 g/dL   Globulin, Total 2.6 1.5 - 4.5 g/dL   Albumin/Globulin Ratio 1.7 1.2 - 2.2   Bilirubin Total 0.6 0.0 - 1.2 mg/dL   Alkaline Phosphatase 53 39 - 117 IU/L   AST 36 0 - 40 IU/L   ALT 48 (H) 0 - 44 IU/L  Lipid panel  Result Value Ref Range   Cholesterol, Total 186 100 - 199 mg/dL   Triglycerides 70 0 - 149 mg/dL   HDL 63 >39 mg/dL   VLDL Cholesterol Cal 14 5 - 40 mg/dL   LDL Calculated 109 (H) 0 - 99 mg/dL   Chol/HDL Ratio 3.0 0.0 - 5.0 ratio      Assessment & Plan:   Problem List Items Addressed This Visit      Cardiovascular and Mediastinum   Essential hypertension, benign (Chronic)    Controlled on recheck; patient checking away from here; limit decongstants and NSAIDs        Other   Medication monitoring encounter     Check liver and kidneys      Relevant Orders   Comprehensive metabolic panel   Hyperlipidemia - Primary (Chronic)    Check fasting labs next week at work; limit saturated fats and get more whole grains  Relevant  Orders   Lipid panel   BMI 35.0-35.9,adult    BMI is 35, but he does have significant muscle mass; we talked about checking out his muscle mass vs fat vs water weight using an impedance monitor       Other Visit Diagnoses    Renal insufficiency       warned him about creatine; limit or avoid decongestants, NSAIDs which can affect kidney function and HTN   Relevant Orders   Microalbumin / creatinine urine ratio   Urinalysis   Need for influenza vaccination       Relevant Orders   Flu Vaccine QUAD 6+ mos PF IM (Fluarix Quad PF) (Completed)       Follow up plan: Return in about 6 months (around 10/31/2018) for follow-up visit with Dr. Sanda Klein.  An after-visit summary was printed and given to the patient at [place].  Please see the patient instructions which may contain other information and recommendations beyond what is mentioned above in the assessment and plan.  No orders of the defined types were placed in this encounter.   Orders Placed This Encounter  Procedures  . Flu Vaccine QUAD 6+ mos PF IM (Fluarix Quad PF)  . Comprehensive metabolic panel  . Lipid panel  . Microalbumin / creatinine urine ratio  . Urinalysis

## 2018-04-19 NOTE — Assessment & Plan Note (Signed)
Check fasting labs next week at work; limit saturated fats and get more whole grains

## 2018-04-19 NOTE — Assessment & Plan Note (Signed)
Controlled on recheck; patient checking away from here; limit decongstants and NSAIDs

## 2018-04-19 NOTE — Assessment & Plan Note (Signed)
BMI is 35, but he does have significant muscle mass; we talked about checking out his muscle mass vs fat vs water weight using an impedance monitor

## 2018-04-19 NOTE — Patient Instructions (Signed)
Please have labs done next week If you have not heard anything from my staff in a week about any orders/referrals/studies from today, please contact us here to follow-up [phone] If you need something for aches or pains, try to use Tylenol (acetaminophen) instead of non-steroidals (which include Aleve, ibuprofen, Advil, Motrin, and naproxen); non-steroidals can cause long-term kidney damage Try to use PLAIN allergy medicine without the decongestant Avoid: phenylephrine, phenylpropanolamine, and pseudoephredine

## 2018-04-27 ENCOUNTER — Other Ambulatory Visit: Payer: Self-pay | Admitting: Family Medicine

## 2018-04-27 DIAGNOSIS — Z5181 Encounter for therapeutic drug level monitoring: Secondary | ICD-10-CM

## 2018-04-27 DIAGNOSIS — N289 Disorder of kidney and ureter, unspecified: Secondary | ICD-10-CM

## 2018-04-27 DIAGNOSIS — E782 Mixed hyperlipidemia: Secondary | ICD-10-CM

## 2018-04-27 LAB — COMPREHENSIVE METABOLIC PANEL
A/G RATIO: 1.8 (ref 1.2–2.2)
ALT: 45 IU/L — ABNORMAL HIGH (ref 0–44)
AST: 37 IU/L (ref 0–40)
Albumin: 4.4 g/dL (ref 3.5–5.5)
Alkaline Phosphatase: 58 IU/L (ref 39–117)
BUN / CREAT RATIO: 10 (ref 9–20)
BUN: 15 mg/dL (ref 6–24)
Bilirubin Total: 0.4 mg/dL (ref 0.0–1.2)
CALCIUM: 9.8 mg/dL (ref 8.7–10.2)
CHLORIDE: 96 mmol/L (ref 96–106)
CO2: 25 mmol/L (ref 20–29)
Creatinine, Ser: 1.5 mg/dL — ABNORMAL HIGH (ref 0.76–1.27)
GFR calc non Af Amer: 53 mL/min/{1.73_m2} — ABNORMAL LOW (ref >59)
GFR, EST AFRICAN AMERICAN: 61 mL/min/{1.73_m2} (ref >59)
GLOBULIN, TOTAL: 2.4 g/dL (ref 1.5–4.5)
Glucose: 86 mg/dL (ref 65–99)
POTASSIUM: 4.8 mmol/L (ref 3.5–5.2)
SODIUM: 139 mmol/L (ref 134–144)
TOTAL PROTEIN: 6.8 g/dL (ref 6.0–8.5)

## 2018-04-27 LAB — URINALYSIS
BILIRUBIN UA: NEGATIVE
GLUCOSE, UA: NEGATIVE
Ketones, UA: NEGATIVE
Leukocytes, UA: NEGATIVE
Nitrite, UA: NEGATIVE
Protein, UA: NEGATIVE
RBC, UA: NEGATIVE
Specific Gravity, UA: 1.015 (ref 1.005–1.030)
UUROB: 0.2 mg/dL (ref 0.2–1.0)
pH, UA: 7 (ref 5.0–7.5)

## 2018-04-27 LAB — LIPID PANEL
CHOLESTEROL TOTAL: 174 mg/dL (ref 100–199)
Chol/HDL Ratio: 3.6 ratio (ref 0.0–5.0)
HDL: 49 mg/dL (ref >39)
LDL Calculated: 108 mg/dL — ABNORMAL HIGH (ref 0–99)
Triglycerides: 86 mg/dL (ref 0–149)
VLDL CHOLESTEROL CAL: 17 mg/dL (ref 5–40)

## 2018-04-27 LAB — MICROALBUMIN / CREATININE URINE RATIO
Creatinine, Urine: 110.8 mg/dL
MICROALB/CREAT RATIO: 3.2 mg/g{creat} (ref 0.0–30.0)
MICROALBUM., U, RANDOM: 3.6 ug/mL

## 2018-04-27 NOTE — Progress Notes (Signed)
Offered to pt to increase statin or leave at same dose and add zetia; asked him to write back Recheck lipids, kidney function, and sgpt in 6-8 weeks; ordered

## 2018-09-03 ENCOUNTER — Other Ambulatory Visit: Payer: Self-pay | Admitting: Nurse Practitioner

## 2018-09-03 DIAGNOSIS — I10 Essential (primary) hypertension: Secondary | ICD-10-CM

## 2018-09-21 ENCOUNTER — Other Ambulatory Visit: Payer: Self-pay | Admitting: Family Medicine

## 2018-09-21 DIAGNOSIS — E782 Mixed hyperlipidemia: Secondary | ICD-10-CM

## 2018-09-21 DIAGNOSIS — Z5181 Encounter for therapeutic drug level monitoring: Secondary | ICD-10-CM

## 2018-09-21 MED ORDER — ATORVASTATIN CALCIUM 80 MG PO TABS: 80.0000 mg | ORAL_TABLET | Freq: Every day | ORAL | 0 refills | Status: DC

## 2018-09-21 NOTE — Telephone Encounter (Signed)
Please call patient about his lab results from November; I do not see that he ever responded back; did he want to add Zetia or increase the atorvastatin? I also had hoped to recheck his kidneys after 6 weeks but don't see that he had that done either

## 2018-09-21 NOTE — Telephone Encounter (Signed)
Pt notified never, received message.  He will have labs done and would like to increase atorvastatin

## 2018-10-18 ENCOUNTER — Ambulatory Visit: Payer: Managed Care, Other (non HMO) | Admitting: Family Medicine

## 2018-11-05 ENCOUNTER — Encounter: Payer: Self-pay | Admitting: Family Medicine

## 2018-12-17 ENCOUNTER — Other Ambulatory Visit: Payer: Self-pay | Admitting: Family Medicine

## 2018-12-19 NOTE — Telephone Encounter (Signed)
lvm for pt to schedule appt in order to receive refill request

## 2018-12-28 DIAGNOSIS — Z9109 Other allergy status, other than to drugs and biological substances: Secondary | ICD-10-CM | POA: Insufficient documentation

## 2018-12-28 DIAGNOSIS — N521 Erectile dysfunction due to diseases classified elsewhere: Secondary | ICD-10-CM | POA: Insufficient documentation

## 2018-12-28 NOTE — Progress Notes (Signed)
Virtual Visit via Video Note  I connected with Chad Hendricks on 12/29/18 at  9:00 AM EDT by a video enabled telemedicine application and verified that I am speaking with the correct person using two identifiers.   Staff discussed the limitations of evaluation and management by telemedicine and the availability of in person appointments. The patient expressed understanding and agreed to proceed.  Patient location: home  My location: home office Other people present:  none HPI  Hypertension Patient is on lisinopril 20mg  daily.  Takes medications as prescribed with no missed doses a month.  He is compliant with low-salt diet.  Denies chest pain, headaches, blurry vision, dizziness, lightheadedness.  BP Readings from Last 3 Encounters:  12/29/18 130/80  04/19/18 138/84  10/19/17 136/84    Hyperlipidemia Patient is taking atorvastatin 80mg  and omega 3's daily.  Takes medications as prescribed with no missed doses a month.  Diet: fried foods once a  Week, red meats 2-3 times a week.  Denies myalgias Lab Results  Component Value Date   CHOL 174 04/26/2018   HDL 49 04/26/2018   LDLCALC 108 (H) 04/26/2018   TRIG 86 04/26/2018   CHOLHDL 3.6 04/26/2018   The 10-year ASCVD risk score Mikey Bussing DC Jr., et al., 2013) is: 9.6%   Values used to calculate the score:     Age: 53 years     Sex: Male     Is Non-Hispanic African American: Yes     Diabetic: No     Tobacco smoker: No     Systolic Blood Pressure: 469 mmHg     Is BP treated: Yes     HDL Cholesterol: 49 mg/dL     Total Cholesterol: 174 mg/dL  Seasonal allergies  Patient takes zyrtec and flonase PRN   Hypogonadism Takes testosterone injections Uses sildenafil PRN for erectile dysfunction Follows-up with urologist- at [facility]   PHQ2/9: Depression screen [facility] 2/9 12/29/2018 04/19/2018 10/19/2017 07/22/2017 02/03/2017  Decreased Interest 0 0 0 0 0  Down, Depressed, Hopeless 0 0 0 0 0  PHQ - 2 Score 0 0 0 0 0  Altered  sleeping 0 0 - - -  Tired, decreased energy 0 0 - - -  Change in appetite 0 0 - - -  Feeling bad or failure about yourself  0 0 - - -  Trouble concentrating 0 0 - - -  Moving slowly or fidgety/restless 0 0 - - -  Suicidal thoughts 0 0 - - -  PHQ-9 Score 0 0 - - -  Difficult doing work/chores Not difficult at all Not difficult at all - - -    PHQ reviewed. Negative  Patient Active Problem List   Diagnosis Date Noted  . Environmental allergies 12/28/2018  . Erectile dysfunction due to diseases classified elsewhere 12/28/2018  . BMI 35.0-35.9,adult 04/19/2018  . Essential hypertension, benign 10/19/2017  . Obesity (BMI 30.0-34.9) 10/19/2017  . Hx of colonic polyps   . Benign neoplasm of cecum   . Benign neoplasm of ascending colon   . Hyperlipidemia 06/15/2016  . Hypotestosteronism 12/17/2014    Past Medical History:  Diagnosis Date  . Allergy   . Hyperlipidemia   . Hypertension   . Wears contact lenses     Past Surgical History:  Procedure Laterality Date  . COLONOSCOPY    . COLONOSCOPY WITH PROPOFOL N/A 07/20/2016   Procedure: COLONOSCOPY WITH PROPOFOL;  Surgeon: Lucilla Lame, MD;  Location: [place];  Service: Endoscopy;  Laterality: N/A;  .  VASECTOMY      Social History   Tobacco Use  . Smoking status: Never Smoker  . Smokeless tobacco: Never Used  Substance Use Topics  . Alcohol use: Yes    Alcohol/week: 2.0 standard drinks    Types: 2 Glasses of wine per week     Current Outpatient Medications:  .  atorvastatin (LIPITOR) 80 MG tablet, Take 1 tablet (80 mg total) by mouth daily at 6 PM., Disp: 90 tablet, Rfl: 1 .  cetirizine (ZYRTEC) 10 MG tablet, Take by mouth., Disp: , Rfl:  .  Cholecalciferol (VITAMIN D3) 5000 units CAPS, Take 1 capsule by mouth daily., Disp: , Rfl:  .  co-enzyme Q-10 50 MG capsule, Take 50 mg by mouth daily., Disp: , Rfl:  .  DHA-EPA-VITAMIN E PO, Take 1 tablet by mouth daily. , Disp: , Rfl:  .  fluticasone (FLONASE) 50  MCG/ACT nasal spray, Place 2 sprays into both nostrils daily., Disp: 16 g, Rfl: 5 .  lisinopril (ZESTRIL) 20 MG tablet, Take 1 tablet (20 mg total) by mouth daily., Disp: 90 tablet, Rfl: 1 .  Multiple Vitamins-Minerals (MULTIVITAMIN ADULT PO), Take 1 capsule by mouth daily., Disp: , Rfl:  .  Omega-3 Fatty Acids (OMEGA 3 500) 500 MG CAPS, Take 500 mg by mouth daily., Disp: , Rfl:  .  sildenafil (REVATIO) 20 MG tablet, Take 1-5 tablets by mouth as needed., Disp: , Rfl:  .  testosterone cypionate (DEPOTESTOSTERONE CYPIONATE) 200 MG/ML injection, Inject 1 mL into the muscle every 14 (fourteen) days. , Disp: , Rfl:   Allergies  Allergen Reactions  . Latex Rash    Only if wears gloves for extended period    ROS   No other specific complaints in a complete review of systems (except as listed in HPI above).  Objective  Vitals:   12/29/18 0915  BP: 130/80  Pulse: (!) 52  Resp: 16     There is no height or weight on file to calculate BMI.  Nursing Note and Vital Signs reviewed.  Physical Exam   Constitutional: Patient appears well-developed and well-nourished. No distress.  HENT: Head: Normocephalic and atraumatic. Cardiovascular: bradycardic rate- states resting HR at home is between 48-55bpm Pulmonary/Chest: Effort normal  Musculoskeletal: Normal range of motion,  Neurological: alert and oriented, speech normal.  Skin: No rash noted. No erythema.  Psychiatric: Patient has a normal mood and affect. behavior is normal. Judgment and thought content normal.    Assessment & Plan  1. Essential hypertension, benign Stable continue  - lisinopril (ZESTRIL) 20 MG tablet; Take 1 tablet (20 mg total) by mouth daily.  Dispense: 90 tablet; Refill: 1 - Comprehensive Metabolic Panel (CMET)  2. Mixed hyperlipidemia Discussed diet  - atorvastatin (LIPITOR) 80 MG tablet; Take 1 tablet (80 mg total) by mouth daily at 6 PM.  Dispense: 90 tablet; Refill: 1 - Lipid Profile  3.  Hypotestosteronism Follow-up with urology   4. Obesity (BMI 30.0-34.9) Value of BMI in this individual is limited as he has significant muscle mass   5. Environmental allergies PRN meds   6. Erectile dysfunction due to diseases classified elsewhere PRN meds, follows up with urology   7. Educated About Covid-19 Virus Infection Do not recommend antibody testing      Follow Up Instructions:    I discussed the assessment and treatment plan with the patient. The patient was provided an opportunity to ask questions and all were answered. The patient agreed with the plan and demonstrated an  understanding of the instructions.   The patient was advised to call back or seek an in-person evaluation if the symptoms worsen or if the condition fails to improve as anticipated.  I provided 21 minutes of non-face-to-face time during this encounter.   Fredderick Severance, NP

## 2018-12-29 ENCOUNTER — Encounter: Payer: Self-pay | Admitting: Nurse Practitioner

## 2018-12-29 ENCOUNTER — Ambulatory Visit (INDEPENDENT_AMBULATORY_CARE_PROVIDER_SITE_OTHER): Payer: Managed Care, Other (non HMO) | Admitting: Nurse Practitioner

## 2018-12-29 VITALS — BP 130/80 | HR 52 | Resp 16

## 2018-12-29 DIAGNOSIS — E782 Mixed hyperlipidemia: Secondary | ICD-10-CM | POA: Diagnosis not present

## 2018-12-29 DIAGNOSIS — N521 Erectile dysfunction due to diseases classified elsewhere: Secondary | ICD-10-CM

## 2018-12-29 DIAGNOSIS — I10 Essential (primary) hypertension: Secondary | ICD-10-CM

## 2018-12-29 DIAGNOSIS — E349 Endocrine disorder, unspecified: Secondary | ICD-10-CM

## 2018-12-29 DIAGNOSIS — Z9109 Other allergy status, other than to drugs and biological substances: Secondary | ICD-10-CM | POA: Diagnosis not present

## 2018-12-29 DIAGNOSIS — Z7189 Other specified counseling: Secondary | ICD-10-CM

## 2018-12-29 MED ORDER — ATORVASTATIN CALCIUM 80 MG PO TABS: 80.0000 mg | ORAL_TABLET | Freq: Every day | ORAL | 1 refills | Status: DC

## 2018-12-29 MED ORDER — LISINOPRIL 20 MG PO TABS: 20.0000 mg | ORAL_TABLET | Freq: Every day | ORAL | 1 refills | Status: DC

## 2019-01-11 ENCOUNTER — Other Ambulatory Visit: Payer: Self-pay | Admitting: Nurse Practitioner

## 2019-01-11 DIAGNOSIS — R748 Abnormal levels of other serum enzymes: Secondary | ICD-10-CM

## 2019-01-11 LAB — COMPREHENSIVE METABOLIC PANEL
ALT: 59 IU/L — ABNORMAL HIGH (ref 0–44)
AST: 52 IU/L — ABNORMAL HIGH (ref 0–40)
Albumin/Globulin Ratio: 1.8 (ref 1.2–2.2)
Albumin: 4.3 g/dL (ref 3.8–4.9)
Alkaline Phosphatase: 57 IU/L (ref 39–117)
BUN/Creatinine Ratio: 13 (ref 9–20)
BUN: 19 mg/dL (ref 6–24)
Bilirubin Total: 0.6 mg/dL (ref 0.0–1.2)
CO2: 27 mmol/L (ref 20–29)
Calcium: 9.5 mg/dL (ref 8.7–10.2)
Chloride: 98 mmol/L (ref 96–106)
Creatinine, Ser: 1.43 mg/dL — ABNORMAL HIGH (ref 0.76–1.27)
GFR calc Af Amer: 65 mL/min/{1.73_m2} (ref >59)
GFR calc non Af Amer: 56 mL/min/{1.73_m2} — ABNORMAL LOW (ref >59)
Globulin, Total: 2.4 g/dL (ref 1.5–4.5)
Glucose: 82 mg/dL (ref 65–99)
Potassium: 4.4 mmol/L (ref 3.5–5.2)
Sodium: 140 mmol/L (ref 134–144)
Total Protein: 6.7 g/dL (ref 6.0–8.5)

## 2019-01-11 LAB — LIPID PANEL
Chol/HDL Ratio: 2.7 ratio (ref 0.0–5.0)
Cholesterol, Total: 142 mg/dL (ref 100–199)
HDL: 52 mg/dL (ref >39)
LDL Calculated: 71 mg/dL (ref 0–99)
Triglycerides: 95 mg/dL (ref 0–149)
VLDL Cholesterol Cal: 19 mg/dL (ref 5–40)

## 2019-01-14 LAB — HEPATITIS PANEL, ACUTE
Hep A IgM: NEGATIVE
Hep B C IgM: NEGATIVE
Hep C Virus Ab: <0.1 s/co ratio (ref 0.0–0.9)
Hepatitis B Surface Ag: NEGATIVE

## 2019-01-14 LAB — SPECIMEN STATUS REPORT

## 2019-07-03 ENCOUNTER — Ambulatory Visit: Payer: Managed Care, Other (non HMO) | Admitting: Family Medicine

## 2019-07-31 DIAGNOSIS — N1831 Chronic kidney disease, stage 3a: Secondary | ICD-10-CM | POA: Insufficient documentation

## 2019-07-31 NOTE — Progress Notes (Signed)
Name: Chad Hendricks   MRN: [id number]    DOB: July 30, 1965   Date:08/01/2019       Progress Note  Subjective  Chief Complaint  Chief Complaint  Patient presents with  . Hyperlipidemia  . Hypertension    I connected with  Jacqulyn Ducking  on 08/01/19 at  2:40 PM EST by a video enabled telemedicine application and verified that I am speaking with the correct person using two identifiers.  I discussed the limitations of evaluation and management by telemedicine and the availability of in person appointments. The patient expressed understanding and agreed to proceed. Staff also discussed with the patient that there may be a patient responsible charge related to this service. Patient Location: Home Provider Location: [facility] Additional Individuals present: none Attempted to contact at 2:20 and no response.  Tried again at 2:30 with success  HPI Patient is a 54 y.o.male who follows up today. He was last seen at [facility] 12/29/2018, with that note reviewed and labs obtained after that visit.  Labs obtained 01/10/2019 after that visit showed an elevated ALT/AST was 59/52 and also increased GFR/Cr at 56/1.43 with Hepatitis panel negative and remainder of comp panel negative, and plans to recheck again in about 12 weeks. His ALT/AST was 45/37 and Creatine/GFR 1.5/53  on labs 04/2018. In May 2019, Creatine/GFR was 1.37/59. Creatine 1.3 in 06/2015.  All in all, noted has been feeling well.   CKD - Last kidney function tests as noted above, Last UA end of 2019 ok. NSAID use - occas ibuprofen not even once a month  Mild Increased transaminases on last lab check 01/10/2019, Hepatitis panel negative. did have statin increased to 80 mg from 40 mg 04/2018. Denies any recent abdominal pains, change in bowel habits.   Hypertension Patient is on lisinopril 20mg  daily, takes regularly BP's well controlled in recent past. Wife checks regularly, 130/75-80 routinely. Diet - is compliant with low-salt diet.   Denies chest pain, palp's, SOB, LE swelling, increased HA's  BP Readings from Last 3 Encounters:  08/01/19 130/80  12/29/18 130/80  04/19/18 138/84    Hyperlipidemia Patient is taking atorvastatin 80mg  and omega 3's daily. Takes medications regularly Diet: fried foods maybe once a month, eating healthy Denies myalgias Lab Results  Component Value Date   CHOL 142 01/10/2019   HDL 52 01/10/2019   LDLCALC 71 01/10/2019   TRIG 95 01/10/2019   CHOLHDL 2.7 01/10/2019   Seasonal allergies  Patient takes zyrtec prn normally in spring when needed, and flonase not needed in last year  Hypogonadism/ED Takes testosterone injections Uses sildenafil PRN for erectile dysfunction Follows-up with urologist- at [facility], appt on Friday  Colonic polyps - last scope 07/20/2016 and f/u rec'ed 3 years after last (tubular adenomas X 2) he thinks. He ntoed was 5 years after first colonoscopy and he thinks was told 3 years after last one noting the polyps found.   Tob - never smoker Alcohol - occas beer  weekends Activity level - workouts in home garage, does insanity workouts 2-3X/week. Very active.Weight has remained stable  Patient Active Problem List   Diagnosis Date Noted  . Stage 3a chronic kidney disease 07/31/2019  . Environmental allergies 12/28/2018  . Erectile dysfunction due to diseases classified elsewhere 12/28/2018  . BMI 35.0-35.9,adult 04/19/2018  . Essential hypertension, benign 10/19/2017  . Obesity (BMI 30.0-34.9) 10/19/2017  . Hx of colonic polyps   . Benign neoplasm of cecum   . Benign neoplasm of ascending colon   .  Hyperlipidemia 06/15/2016  . Hypotestosteronism 12/17/2014    Past Surgical History:  Procedure Laterality Date  . COLONOSCOPY    . COLONOSCOPY WITH PROPOFOL N/A 07/20/2016   Procedure: COLONOSCOPY WITH PROPOFOL;  Surgeon: Lucilla Lame, MD;  Location: [facility];  Service: Endoscopy;  Laterality: N/A;  . VASECTOMY      Family History   Problem Relation Age of Onset  . Cancer Mother   . Kidney disease Mother   . Kidney disease Father   . Prostate cancer Brother   . Prostate cancer Brother   . Kidney disease Brother   . Diabetes Brother   . Diabetes Brother   . Diabetes Brother   . Hypertension Maternal Grandmother   . Hypertension Maternal Grandfather   . Hypertension Paternal Grandmother   . Hypertension Paternal Grandfather     Social History   Socioeconomic History  . Marital status: Married    Spouse name: Not on file  . Number of children: Not on file  . Years of education: Not on file  . Highest education level: Not on file  Occupational History  . Not on file  Tobacco Use  . Smoking status: Never Smoker  . Smokeless tobacco: Never Used  Substance and Sexual Activity  . Alcohol use: Yes    Alcohol/week: 2.0 standard drinks    Types: 2 Glasses of wine per week  . Drug use: No  . Sexual activity: Yes  Other Topics Concern  . Not on file  Social History Narrative  . Not on file   Social Determinants of Health   Financial Resource Strain:   . Difficulty of Paying Living Expenses: Not on file  Food Insecurity:   . Worried About Charity fundraiser in the Last Year: Not on file  . Ran Out of Food in the Last Year: Not on file  Transportation Needs:   . Lack of Transportation (Medical): Not on file  . Lack of Transportation (Non-Medical): Not on file  Physical Activity:   . Days of Exercise per Week: Not on file  . Minutes of Exercise per Session: Not on file  Stress:   . Feeling of Stress : Not on file  Social Connections:   . Frequency of Communication with Friends and Family: Not on file  . Frequency of Social Gatherings with Friends and Family: Not on file  . Attends Religious Services: Not on file  . Active Member of Clubs or Organizations: Not on file  . Attends Archivist Meetings: Not on file  . Marital Status: Not on file  Intimate Partner Violence:   . Fear of  Current or Ex-Partner: Not on file  . Emotionally Abused: Not on file  . Physically Abused: Not on file  . Sexually Abused: Not on file     Current Outpatient Medications:  .  atorvastatin (LIPITOR) 80 MG tablet, Take 1 tablet (80 mg total) by mouth daily at 6 PM., Disp: 90 tablet, Rfl: 1 .  cetirizine (ZYRTEC) 10 MG tablet, Take by mouth., Disp: , Rfl:  .  Cholecalciferol (VITAMIN D3) 5000 units CAPS, Take 1 capsule by mouth daily., Disp: , Rfl:  .  co-enzyme Q-10 50 MG capsule, Take 50 mg by mouth daily., Disp: , Rfl:  .  DHA-EPA-VITAMIN E PO, Take 1 tablet by mouth daily. , Disp: , Rfl:  .  ibuprofen (ADVIL) 800 MG tablet, Take 800 mg by mouth 3 (three) times daily as needed., Disp: , Rfl:  .  lisinopril (ZESTRIL) 20 MG tablet, Take 1 tablet (20 mg total) by mouth daily., Disp: 90 tablet, Rfl: 1 .  Multiple Vitamins-Minerals (MULTIVITAMIN ADULT PO), Take 1 capsule by mouth daily., Disp: , Rfl:  .  Omega-3 Fatty Acids (OMEGA 3 500) 500 MG CAPS, Take 500 mg by mouth daily., Disp: , Rfl:  .  testosterone cypionate (DEPOTESTOSTERONE CYPIONATE) 200 MG/ML injection, Inject 1 mL into the muscle every 14 (fourteen) days. , Disp: , Rfl:   Allergies  Allergen Reactions  . Latex Rash    Only if wears gloves for extended period    With staff assistance, above reviewed with the patient today.   ROS: As per HPI, no vision concerns, no feeling down or depression concerns, otherwise no specific complaints on a limited and focused system review  Screened every day at work for [employer] - works at [employer]  Objective   BP 130/80   Pulse (!) 52   Temp (!) 97.3 F (36.3 C) (Temporal)   Ht 6\' 1"  (1.854 m)   Wt 250 lb (113.4 kg)   BMI 32.98 kg/m    Wt Readings from Last 3 Encounters:  08/01/19 250 lb (113.4 kg)  04/19/18 265 lb 4.8 oz (120.3 kg)  10/19/17 244 lb 1.6 oz (110.7 kg)    Physical Exam  Patient appears in NAD, very pleasant HENT: Head: Normocephalic and atraumatic.  Breathing  Effort normal. No respiratory distress. Speaking in complete sentences Neurological: Pt is alert and oriented,  Speech is normal.  Psychiatric: Patient has a normal mood and affect, behavior is normal. Very appropriate with conversation, judgment and thought content normal.    PHQ2/9: Depression screen [facility] 2/9 08/01/2019 12/29/2018 04/19/2018 10/19/2017 07/22/2017  Decreased Interest 0 0 0 0 0  Down, Depressed, Hopeless 0 0 0 0 0  PHQ - 2 Score 0 0 0 0 0  Altered sleeping 0 0 0 - -  Tired, decreased energy 0 0 0 - -  Change in appetite 0 0 0 - -  Feeling bad or failure about yourself  0 0 0 - -  Trouble concentrating 0 0 0 - -  Moving slowly or fidgety/restless 0 0 0 - -  Suicidal thoughts 0 0 0 - -  PHQ-9 Score 0 0 0 - -  Difficult doing work/chores - Not difficult at all Not difficult at all - -   PHQ-2/9 Result reviewed , neg  Fall Risk: Fall Risk  08/01/2019 12/29/2018 04/19/2018 10/19/2017 07/22/2017  Falls in the past year? 0 0 0 No No  Number falls in past yr: 0 0 - - -  Injury with Fall? 0 0 - - -     Assessment & Plan  1. Essential hypertension, benign Blood pressure has remained well controlled with checks by his wife at home to date. We will continue the lisinopril with that medicine refilled today. - lisinopril (ZESTRIL) 20 MG tablet; Take 1 tablet (20 mg total) by mouth daily.  Dispense: 90 tablet; Refill: 3 - Comprehensive metabolic panel - CBC with Differential/Platelet - Urinalysis, Complete  2. Mixed hyperlipidemia Continue medications, and will recheck labs. Also continue the diet modifications he follows and the exercise activities to help - atorvastatin (LIPITOR) 80 MG tablet; Take 1 tablet (80 mg total) by mouth daily at 6 PM.  Dispense: 90 tablet; Refill: 3 - Lipid panel - Comprehensive metabolic panel  3. Hypotestosteronism Followed by urology, has an appointment this Friday. He noted he has not had problems with polycythemia  with the testosterone  injections, with a CBC ordered. Also urology has been following PSAs. - CBC with Differential/Platelet  4. BMI 35.0-35.9,adult Weight has remained stable recent past.  5. Erectile dysfunction due to diseases classified elsewhere Uses sildenafil as needed, not all that frequently by his report  6. Environmental allergies Uses the antihistamine as needed, usually needs more in the spring, and has not needed the Flonase product in the past year per his report.  7. Hx of colonic polyps Asked that he call the gastroenterology office, as if it was recommended for a follow-up in 3 years he would be due presently.  If they feel he can wait for 5 years after his last scope, that is up to them.  He stated he would do so.  8. Stage 3a chronic kidney disease Mention to him the importance of following his kidney function and labs ordered. Also noted if he has any further worsening over time, may ask nephrology to see. Continue to try to avoid NSAIDs as we discussed today as well. - Comprehensive metabolic panel - Urinalysis, Complete  9. Abnormal transaminases We will recheck labs. Is on high-dose statins, with the dose bumped from 40 to 80 mg at the end of 2019.  Await repeat lab test. - Comprehensive metabolic panel  Can follow-up with the labs online, and he gets them at [facility] with the labs order to [facility].  We will tentatively schedule follow-up in person for at the latest in 6 months, will follow-up sooner as needed.  I discussed the assessment and treatment plan with the patient. The patient was provided an opportunity to ask questions and all were answered. The patient agreed with the plan and demonstrated an understanding of the instructions.   I provided 30 minutes of non-face-to-face time during this encounter that included discussing at length patient's sx/history, pertinent pmhx, medications, treatment and follow up plan. This time also included the necessary documentation, orders,  and chart review.

## 2019-08-01 ENCOUNTER — Encounter: Payer: Self-pay | Admitting: Internal Medicine

## 2019-08-01 ENCOUNTER — Ambulatory Visit (INDEPENDENT_AMBULATORY_CARE_PROVIDER_SITE_OTHER): Payer: Managed Care, Other (non HMO) | Admitting: Internal Medicine

## 2019-08-01 VITALS — BP 130/80 | HR 52 | Temp 97.3°F | Ht 73.0 in | Wt 250.0 lb

## 2019-08-01 DIAGNOSIS — I129 Hypertensive chronic kidney disease with stage 1 through stage 4 chronic kidney disease, or unspecified chronic kidney disease: Secondary | ICD-10-CM

## 2019-08-01 DIAGNOSIS — I10 Essential (primary) hypertension: Secondary | ICD-10-CM

## 2019-08-01 DIAGNOSIS — E349 Endocrine disorder, unspecified: Secondary | ICD-10-CM | POA: Diagnosis not present

## 2019-08-01 DIAGNOSIS — E782 Mixed hyperlipidemia: Secondary | ICD-10-CM | POA: Diagnosis not present

## 2019-08-01 DIAGNOSIS — Z6835 Body mass index (BMI) 35.0-35.9, adult: Secondary | ICD-10-CM

## 2019-08-01 DIAGNOSIS — R748 Abnormal levels of other serum enzymes: Secondary | ICD-10-CM

## 2019-08-01 DIAGNOSIS — N1831 Chronic kidney disease, stage 3a: Secondary | ICD-10-CM

## 2019-08-01 DIAGNOSIS — Z86010 Personal history of colonic polyps: Secondary | ICD-10-CM

## 2019-08-01 DIAGNOSIS — Z9109 Other allergy status, other than to drugs and biological substances: Secondary | ICD-10-CM

## 2019-08-01 DIAGNOSIS — N521 Erectile dysfunction due to diseases classified elsewhere: Secondary | ICD-10-CM

## 2019-08-01 MED ORDER — ATORVASTATIN CALCIUM 80 MG PO TABS: 80.0000 mg | ORAL_TABLET | Freq: Every day | ORAL | 3 refills | Status: DC

## 2019-08-01 MED ORDER — LISINOPRIL 20 MG PO TABS: 20.0000 mg | ORAL_TABLET | Freq: Every day | ORAL | 3 refills | Status: DC

## 2019-08-05 LAB — LIPID PANEL
Chol/HDL Ratio: 3.2 ratio (ref 0.0–5.0)
Cholesterol, Total: 155 mg/dL (ref 100–199)
HDL: 48 mg/dL (ref >39)
LDL Chol Calc (NIH): 90 mg/dL (ref 0–99)
Triglycerides: 90 mg/dL (ref 0–149)
VLDL Cholesterol Cal: 17 mg/dL (ref 5–40)

## 2019-08-05 LAB — CBC WITH DIFFERENTIAL/PLATELET
Basophils Absolute: 0 10*3/uL (ref 0.0–0.2)
Basos: 0 %
EOS (ABSOLUTE): 0.1 10*3/uL (ref 0.0–0.4)
Eos: 2 %
Hematocrit: 50.9 % (ref 37.5–51.0)
Hemoglobin: 16.7 g/dL (ref 13.0–17.7)
Immature Grans (Abs): 0 10*3/uL (ref 0.0–0.1)
Immature Granulocytes: 0 %
Lymphocytes Absolute: 1.5 10*3/uL (ref 0.7–3.1)
Lymphs: 31 %
MCH: 28 pg (ref 26.6–33.0)
MCHC: 32.8 g/dL (ref 31.5–35.7)
MCV: 85 fL (ref 79–97)
Monocytes Absolute: 0.6 10*3/uL (ref 0.1–0.9)
Monocytes: 12 %
Neutrophils Absolute: 2.7 10*3/uL (ref 1.4–7.0)
Neutrophils: 55 %
Platelets: 234 10*3/uL (ref 150–450)
RBC: 5.96 x10E6/uL — ABNORMAL HIGH (ref 4.14–5.80)
RDW: 13.4 % (ref 11.6–15.4)
WBC: 4.9 10*3/uL (ref 3.4–10.8)

## 2019-08-05 LAB — URINALYSIS, COMPLETE
Bilirubin, UA: NEGATIVE
Glucose, UA: NEGATIVE
Ketones, UA: NEGATIVE
Leukocytes,UA: NEGATIVE
Nitrite, UA: NEGATIVE
Protein,UA: NEGATIVE
RBC, UA: NEGATIVE
Specific Gravity, UA: 1.015 (ref 1.005–1.030)
Urobilinogen, Ur: 0.2 mg/dL (ref 0.2–1.0)
pH, UA: 6.5 (ref 5.0–7.5)

## 2019-08-05 LAB — COMPREHENSIVE METABOLIC PANEL
ALT: 47 IU/L — ABNORMAL HIGH (ref 0–44)
AST: 32 IU/L (ref 0–40)
Albumin/Globulin Ratio: 1.7 (ref 1.2–2.2)
Albumin: 4.5 g/dL (ref 3.8–4.9)
Alkaline Phosphatase: 62 IU/L (ref 39–117)
BUN/Creatinine Ratio: 8 — ABNORMAL LOW (ref 9–20)
BUN: 12 mg/dL (ref 6–24)
Bilirubin Total: 0.6 mg/dL (ref 0.0–1.2)
CO2: 25 mmol/L (ref 20–29)
Calcium: 9.7 mg/dL (ref 8.7–10.2)
Chloride: 103 mmol/L (ref 96–106)
Creatinine, Ser: 1.46 mg/dL — ABNORMAL HIGH (ref 0.76–1.27)
GFR calc Af Amer: 63 mL/min/{1.73_m2} (ref >59)
GFR calc non Af Amer: 54 mL/min/{1.73_m2} — ABNORMAL LOW (ref >59)
Globulin, Total: 2.6 g/dL (ref 1.5–4.5)
Glucose: 87 mg/dL (ref 65–99)
Potassium: 4.6 mmol/L (ref 3.5–5.2)
Sodium: 141 mmol/L (ref 134–144)
Total Protein: 7.1 g/dL (ref 6.0–8.5)

## 2019-08-05 LAB — MICROSCOPIC EXAMINATION
Bacteria, UA: NONE SEEN
Casts: NONE SEEN /lpf
Epithelial Cells (non renal): NONE SEEN /hpf (ref 0–10)
RBC, Urine: NONE SEEN /hpf (ref 0–2)

## 2019-08-05 NOTE — Progress Notes (Signed)
Coltan, Your labs from 08/04/19 were reviewed. The lipid panel remains stable. The lower the LDL cholesterol (lousy type) the better and presently, the LDL is 90. In individuals with known heart disease or diabetes, it is recommended to have LDL < 70. Continue the current doses of atorvastatin and omega-3 presently. Trying to avoid saturated fats in the diet and continuing to have some regular aerobic exercise are helpful as well.  Your comprehensive panel shows that your kidney function is abnormal, and has remained relatively stable over the past year. The urinalysis was negative (no cells or protein in the urine). Continuing to try to avoid NSAIDs like ibuprofen or aleve products is recommended. If this worsens over time as following, likely will ask for input from a nephrologist (kidney doctor).  The liver enzymes (ALT, AST) are better and the other liver function tests are all normal as is the remainder of your comprehensive metabolic panel.  The CBC showed an elevated RBC (red blood cell count), and high normal hemoglobin and hematocrit, with this very likely due to your testosterone supplementation. Urology has been following, and continuing to monitor these counts are important with the supplementation as are PSA levels. Last PSA I saw in chart was 0.91 in Feb 2018. Urology has likely been following the PSA (don't have access to their records in [place]), and if you haven't had one done in past year, we can add to your next blood draw. Let's keep the planned follow-up noted after our recent visit, and do your best to eating healthy, continuing to exercise, and staying safe.  Dr. Roxan Hockey

## 2020-02-01 ENCOUNTER — Ambulatory Visit: Payer: Managed Care, Other (non HMO) | Admitting: Internal Medicine

## 2020-08-26 ENCOUNTER — Other Ambulatory Visit: Payer: Self-pay

## 2020-08-26 DIAGNOSIS — E782 Mixed hyperlipidemia: Secondary | ICD-10-CM

## 2020-08-26 DIAGNOSIS — I10 Essential (primary) hypertension: Secondary | ICD-10-CM

## 2020-08-26 MED ORDER — ATORVASTATIN CALCIUM 80 MG PO TABS: 80.0000 mg | ORAL_TABLET | Freq: Every day | ORAL | 3 refills | Status: DC

## 2020-08-26 MED ORDER — LISINOPRIL 20 MG PO TABS: 20.0000 mg | ORAL_TABLET | Freq: Every day | ORAL | 3 refills | Status: DC

## 2020-08-26 NOTE — Progress Notes (Signed)
Refills provided, needs appt for follow up soon.

## 2020-08-26 NOTE — Progress Notes (Signed)
Needs appt for follow up soon.

## 2020-08-26 NOTE — Progress Notes (Signed)
Called patient and offered him an appt for tomorrow but he does not get off work till after 12. Said he would just keep his appt he has for next week. Thinks he will be out of meds the day before his appt.

## 2020-09-02 ENCOUNTER — Ambulatory Visit: Payer: Managed Care, Other (non HMO) | Admitting: Family Medicine

## 2020-09-10 ENCOUNTER — Ambulatory Visit: Payer: Managed Care, Other (non HMO) | Admitting: Physician Assistant

## 2020-09-10 ENCOUNTER — Other Ambulatory Visit: Payer: Self-pay

## 2020-09-10 ENCOUNTER — Encounter: Payer: Self-pay | Admitting: Physician Assistant

## 2020-09-10 ENCOUNTER — Encounter: Payer: Self-pay | Admitting: *Deleted

## 2020-09-10 VITALS — BP 130/86 | HR 84 | Temp 98.1°F | Resp 18 | Ht 73.0 in | Wt 250.6 lb

## 2020-09-10 DIAGNOSIS — I10 Essential (primary) hypertension: Secondary | ICD-10-CM

## 2020-09-10 DIAGNOSIS — D369 Benign neoplasm, unspecified site: Secondary | ICD-10-CM | POA: Diagnosis not present

## 2020-09-10 DIAGNOSIS — E349 Endocrine disorder, unspecified: Secondary | ICD-10-CM

## 2020-09-10 DIAGNOSIS — E782 Mixed hyperlipidemia: Secondary | ICD-10-CM | POA: Diagnosis not present

## 2020-09-10 DIAGNOSIS — Z23 Encounter for immunization: Secondary | ICD-10-CM | POA: Diagnosis not present

## 2020-09-10 DIAGNOSIS — R1319 Other dysphagia: Secondary | ICD-10-CM

## 2020-09-10 NOTE — Progress Notes (Signed)
Established patient visit   Patient: Chad Hendricks   DOB: September 20, 1965   55 y.o. Male  MRN: [id number] Visit Date: 09/10/2020  Today's healthcare provider: Trinna Post, PA-C   Chief Complaint  Patient presents with  . Follow-up  . Hypertension   Subjective    HPI   Lipid/Cholesterol, Follow-up  Last lipid panel Other pertinent labs  Lab Results  Component Value Date   CHOL 155 08/04/2019   HDL 48 08/04/2019   LDLCALC 90 08/04/2019   TRIG 90 08/04/2019   CHOLHDL 3.2 08/04/2019   Lab Results  Component Value Date   ALT 47 (H) 08/04/2019   AST 32 08/04/2019   PLT 234 08/04/2019   TSH 1.380 06/21/2015     He was last seen for this 12 months ago.  Management since that visit includes continue statin.  He reports excellent compliance with treatment. He is not having side effects.   Symptoms: No chest pain No chest pressure/discomfort  No dyspnea No lower extremity edema  No numbness or tingling of extremity No orthopnea  No palpitations No paroxysmal nocturnal dyspnea  No speech difficulty No syncope   Current diet: well balanced Current exercise: none  The 10-year ASCVD risk score Mikey Bussing DC Jr., et al., 2013) is: 10.2%  --------------------------------------------------------------------------------------------------- Hypertension, follow-up  BP Readings from Last 3 Encounters:  09/10/20 130/86  08/01/19 130/80  12/29/18 130/80   Wt Readings from Last 3 Encounters:  09/10/20 250 lb 9.6 oz (113.7 kg)  08/01/19 250 lb (113.4 kg)  04/19/18 265 lb 4.8 oz (120.3 kg)     He was last seen for hypertension 1 years ago.  BP at that visit was normal. Management since that visit includes continue lisinopril 20 mg daily.  He reports excellent compliance with treatment. He is not having side effects.  He is following a Regular diet. He is not exercising. He does not smoke.  Use of agents associated with hypertension: none.   Outside blood  pressures are not checked. Symptoms: No chest pain No chest pressure  No palpitations No syncope  No dyspnea No orthopnea  No paroxysmal nocturnal dyspnea No lower extremity edema   Pertinent labs: Lab Results  Component Value Date   CHOL 155 08/04/2019   HDL 48 08/04/2019   LDLCALC 90 08/04/2019   TRIG 90 08/04/2019   CHOLHDL 3.2 08/04/2019   Lab Results  Component Value Date   NA 141 08/04/2019   K 4.6 08/04/2019   CREATININE 1.46 (H) 08/04/2019   GFRNONAA 54 (L) 08/04/2019   GFRAA 63 08/04/2019   GLUCOSE 87 08/04/2019     The 10-year ASCVD risk score Mikey Bussing DC Jr., et al., 2013) is: 10.2%   --------------------------------------------------------------------------------------------------- Due for colonoscopy 2021.  Also reports some fullness deep in his chest over his stomach when eating certain foods, particularly bread and meats. He denies choking and/or regurgitation.       Medications: Outpatient Medications Prior to Visit  Medication Sig  . atorvastatin (LIPITOR) 80 MG tablet Take 1 tablet (80 mg total) by mouth daily at 6 PM.  . cetirizine (ZYRTEC) 10 MG tablet Take by mouth.  . Cholecalciferol (VITAMIN D3) 5000 units CAPS Take 1 capsule by mouth daily.  Marland Kitchen co-enzyme Q-10 50 MG capsule Take 50 mg by mouth daily.  . DHA-EPA-VITAMIN E PO Take 1 tablet by mouth daily.   Marland Kitchen lisinopril (ZESTRIL) 20 MG tablet Take 1 tablet (20 mg total) by mouth daily.  Marland Kitchen  Multiple Vitamins-Minerals (MULTIVITAMIN ADULT PO) Take 1 capsule by mouth daily.  . Omega-3 Fatty Acids (OMEGA 3 500) 500 MG CAPS Take 500 mg by mouth daily.  . sildenafil (REVATIO) 20 MG tablet SMARTSIG:1-5 Tablet(s) By Mouth Daily PRN  . testosterone cypionate (DEPOTESTOSTERONE CYPIONATE) 200 MG/ML injection Inject 1 mL into the muscle every 14 (fourteen) days.   . [DISCONTINUED] ibuprofen (ADVIL) 800 MG tablet Take 800 mg by mouth 3 (three) times daily as needed.   No facility-administered medications prior to  visit.    Review of Systems  All other systems reviewed and are negative.      Objective    BP 130/86   Pulse 84   Temp 98.1 F (36.7 C)   Resp 18   Ht 6\' 1"  (1.854 m)   Wt 250 lb 9.6 oz (113.7 kg)   SpO2 97%   BMI 33.06 kg/m     Physical Exam Constitutional:      Appearance: Normal appearance.  Cardiovascular:     Rate and Rhythm: Normal rate and regular rhythm.     Heart sounds: Normal heart sounds.  Pulmonary:     Effort: Pulmonary effort is normal.     Breath sounds: Normal breath sounds.  Skin:    General: Skin is warm and dry.  Neurological:     Mental Status: He is alert and oriented to person, place, and time. Mental status is at baseline.  Psychiatric:        Mood and Affect: Mood normal.        Behavior: Behavior normal.       No results found for any visits on 09/10/20.  Assessment & Plan    1. Mixed hyperlipidemia  Previously well controlled Continue statin Repeat FLP and CMP Goal LDL < 100   2. Hypotestosteronism  Followed by urologist in [place], [place].  3. Essential hypertension, benign  Well controlled Continue current medications Recheck metabolic panel F/u in 12 months  - HIV Antibody (routine testing w rflx) - TSH - Lipid panel - Comprehensive metabolic panel - CBC with Differential/Platelet  4. Tubular adenoma  - Ambulatory referral to Gastroenterology  5. Need for shingles vaccine  - Varicella-zoster vaccine IM (Shingrix)  6. Esophageal dysphagia  - Ambulatory referral to Gastroenterology   Return in about 6 months (around 03/12/2021).         Trinna Post, PA-C  [employer] [phone] (phone) [phone] (fax)  [place]

## 2020-09-10 NOTE — Patient Instructions (Signed)
Lipid Profile Test Why am I having this test? The lipid profile test can be used to help evaluate your risk for developing heart disease. The test is also used to monitor your levels during treatment for high cholesterol to see if you are reaching your goals. What is being tested? A lipid profile measures the following:  Total cholesterol. Cholesterol is a waxy, fat-like substance in your blood. If your total cholesterol level is high, this can increase your risk for heart disease.  High-density lipoprotein (HDL). This is known as the good cholesterol. Having high levels of HDL decreases your risk for heart disease. Your HDL level may be low if you smoke or do not get enough exercise.  Low-density lipoprotein (LDL). This is known as the bad cholesterol. This type causes plaque to build up in your arteries. Having a low level of LDL is best. Having high levels of LDL increases your risk for heart disease.  Cholesterol to HDL ratio. This is calculated by dividing your total cholesterol by your HDL cholesterol. The ratio is used by health care providers to determine your risk for heart disease. A low ratio is best.  Triglycerides. These are fats that your body can store or burn for energy. Low levels are best. Having high levels of triglycerides increases your risk for heart disease. What kind of sample is taken? A blood sample is required for this test. It is usually collected by inserting a needle into a blood vessel.   How do I prepare for this test? Do not drink alcohol starting at least 24 hours before your test. Follow any instructions from your health care provider about dietary restrictions before your test. Do not eat or drink anything other than water after midnight on the night before the test, or as told by your health care provider. Tell a health care provider about:  All medicines you are taking, including vitamins, herbs, eye drops, creams, and over-the-counter medicines.  Any  medical conditions you have.  Whether you are pregnant or may be pregnant. How are the results reported? Your test results will be reported as values that indicate your cholesterol and triglyceride levels. Your health care provider will compare your results to normal ranges that were established after testing a large group of people (reference ranges). Reference ranges may vary among labs and hospitals. For this test, common reference ranges are: Total cholesterol  Adult or elderly: less than 200 mg/dL.  Child: 120-200 mg/dL.  Infant: 70-175 mg/dL.  Newborn: 53-135 mg/dL. HDL  Male: greater than 45 mg/dL.  Male: greater than 55 mg/dL. HDL reference values based on your risk for heart disease:  Low risk for heart disease: ? Male: 60 mg/dL. ? Male: 70 mg/dL.  Moderate risk for heart disease: ? Male: 45 mg/dL. ? Male: 55 mg/dL.  High risk for heart disease: ? Male: 25 mg/dL. ? Male: 35 mg/dL. LDL  Adults: Your health care provider will determine a target level for LDL based on your risk for heart disease. ? If you are at low risk, your LDL should be 130 mg/dL or less. ? If you are at moderate risk, your LDL should be 100 mg/dL or less. ? If you are at high risk, your LDL should be 70 mg/dL or less.  Children: less than 110 mg/dL. Cholesterol to HDL ratio Reference values based on your risk for heart disease:  Risk that is half the average risk: ? Male: 3.4. ? Male: 3.3.  Average risk: ? Male: 5.0. ?  Male: 4.4.  Risk that is two times average (moderate risk): ? Male: 10.0. ? Male: 7.0.  Risk that is three times average (high risk): ? Male: 24.0. ? Male: 11.0. Triglycerides  Adult or elderly: ? Male: 40-160 mg/dL. ? Male: 35-135 mg/dL.  Children 78-36 years old: ? Male: 40-163 mg/dL. ? Male: 40-128 mg/dL.  Children 53-16 years old: ? Male: 36-138 mg/dL. ? Male: 41-138 mg/dL.  Children 50-33 years old: ? Male: 31-108  mg/dL. ? Male: 35-114 mg/dL.  Children 3-46 years old: ? Male: 30-86 mg/dL. ? Male: 32-99 mg/dL. Triglycerides should be less than 400 mg/dL even when you are not fasting. What do the results mean? Results that are within the reference ranges are considered normal. Total cholesterol, LDL, and triglyceride levels that are higher than the reference ranges can mean that you have an increased risk for heart disease. An HDL level that is lower than the reference range can also indicate an increased risk. Talk with your health care provider about what your results mean. Questions to ask your health care provider Ask your health care provider, or the department that is doing the test:  When will my results be ready?  How will I get my results?  What are my treatment options?  What other tests do I need?  What are my next steps? Summary  The lipid profile test can be used to help predict the likelihood that you will develop heart disease. It can also help monitor your cholesterol levels during treatment.  A lipid profile measures your total cholesterol, high-density lipoprotein (HDL), low-density lipoprotein (LDL), cholesterol to HDL ratio, and triglycerides.  Total cholesterol, LDL, and triglyceride levels that are higher than the reference ranges can indicate an increased risk for heart disease.  An HDL level that is lower than the reference range can indicate an increased risk for heart disease.  Talk with your health care provider about what your results mean. This information is not intended to replace advice given to you by your health care provider. Make sure you discuss any questions you have with your health care provider. Document Revised: 09/13/2019 Document Reviewed: 09/13/2019 Elsevier Patient Education  2021 [employer].

## 2020-09-11 LAB — CBC WITH DIFFERENTIAL/PLATELET
Basophils Absolute: 0 10*3/uL (ref 0.0–0.2)
Basos: 0 %
EOS (ABSOLUTE): 0.1 10*3/uL (ref 0.0–0.4)
Eos: 2 %
Hematocrit: 47.8 % (ref 37.5–51.0)
Hemoglobin: 15.8 g/dL (ref 13.0–17.7)
Immature Grans (Abs): 0 10*3/uL (ref 0.0–0.1)
Immature Granulocytes: 0 %
Lymphocytes Absolute: 1.4 10*3/uL (ref 0.7–3.1)
Lymphs: 31 %
MCH: 28.1 pg (ref 26.6–33.0)
MCHC: 33.1 g/dL (ref 31.5–35.7)
MCV: 85 fL (ref 79–97)
Monocytes Absolute: 0.5 10*3/uL (ref 0.1–0.9)
Monocytes: 12 %
Neutrophils Absolute: 2.5 10*3/uL (ref 1.4–7.0)
Neutrophils: 55 %
Platelets: 212 10*3/uL (ref 150–450)
RBC: 5.63 x10E6/uL (ref 4.14–5.80)
RDW: 13.6 % (ref 11.6–15.4)
WBC: 4.6 10*3/uL (ref 3.4–10.8)

## 2020-09-11 LAB — TSH: TSH: 0.918 u[IU]/mL (ref 0.450–4.500)

## 2020-09-11 LAB — COMPREHENSIVE METABOLIC PANEL
ALT: 67 IU/L — ABNORMAL HIGH (ref 0–44)
AST: 42 IU/L — ABNORMAL HIGH (ref 0–40)
Albumin/Globulin Ratio: 2.1 (ref 1.2–2.2)
Albumin: 4.6 g/dL (ref 3.8–4.9)
Alkaline Phosphatase: 67 IU/L (ref 44–121)
BUN/Creatinine Ratio: 13 (ref 9–20)
BUN: 17 mg/dL (ref 6–24)
Bilirubin Total: 0.7 mg/dL (ref 0.0–1.2)
CO2: 24 mmol/L (ref 20–29)
Calcium: 9.8 mg/dL (ref 8.7–10.2)
Chloride: 97 mmol/L (ref 96–106)
Creatinine, Ser: 1.34 mg/dL — ABNORMAL HIGH (ref 0.76–1.27)
Globulin, Total: 2.2 g/dL (ref 1.5–4.5)
Glucose: 78 mg/dL (ref 65–99)
Potassium: 4.7 mmol/L (ref 3.5–5.2)
Sodium: 136 mmol/L (ref 134–144)
Total Protein: 6.8 g/dL (ref 6.0–8.5)
eGFR: 63 mL/min/{1.73_m2} (ref >59)

## 2020-09-11 LAB — LIPID PANEL
Chol/HDL Ratio: 2.8 ratio (ref 0.0–5.0)
Cholesterol, Total: 129 mg/dL (ref 100–199)
HDL: 46 mg/dL (ref >39)
LDL Chol Calc (NIH): 65 mg/dL (ref 0–99)
Triglycerides: 92 mg/dL (ref 0–149)
VLDL Cholesterol Cal: 18 mg/dL (ref 5–40)

## 2020-09-11 LAB — HIV ANTIBODY (ROUTINE TESTING W REFLEX): HIV Screen 4th Generation wRfx: NONREACTIVE

## 2020-11-11 ENCOUNTER — Other Ambulatory Visit: Payer: Self-pay

## 2020-11-11 ENCOUNTER — Ambulatory Visit (INDEPENDENT_AMBULATORY_CARE_PROVIDER_SITE_OTHER): Payer: Managed Care, Other (non HMO)

## 2020-11-11 DIAGNOSIS — Z23 Encounter for immunization: Secondary | ICD-10-CM

## 2021-03-10 ENCOUNTER — Ambulatory Visit: Payer: Managed Care, Other (non HMO) | Admitting: Family Medicine

## 2021-03-14 ENCOUNTER — Ambulatory Visit: Payer: Managed Care, Other (non HMO) | Admitting: Family Medicine

## 2021-04-03 NOTE — Progress Notes (Signed)
Established Patient Office Visit  Subjective:  Patient ID: Chad Hendricks, male    DOB: April 23, 1966  Age: 55 y.o. MRN: [id number]  CC:  Chief Complaint  Patient presents with   Hypertension   Hyperlipidemia    HPI STANTON KISSOON presents for follow-up on chronic medical conditions, which include hypertension, hyperlipidemia, elevated LFTs on last CMP in April. He denies abdominal pain, nausea, vomiting, constipation, diarrhea or blood in the stools. He drinks about one alcoholic beverage week and does not take Tylenol or any other over the counter supplements. He does not have a family history of liver disease that he knows of. He did have an elevated hgb about 1 year ago but it was 15.7 in April.   Hypertension: -Medications: Lisinopril 20 mg -Patient is compliant with above medications and reports no side effects. -Checking BP at home (average): average 130-132/80 -Denies any SOB, CP, vision changes, LE edema or symptoms of hypotension -Diet: Likes to eat chicken, fish, some beef, usually skips dinner for protein shakes  -Exercise: weight lifting   HLD: -Medications: Lipitor 80 mg, Omega 3 fatty acids -Patient is compliant with above medications and reports no side effects.  -Last lipid panel: WNL 4/22  Hypogonadism/ED: -Testosterone injections from Urology, saw them last month )no changes), sees every 6 months  -Sildenafil as needed  Health maintenance: -Colonoscopy: Last colonoscopy in 07/2016 showing 2 tubular adenomas negative for high graded dysplasia and malignancy with recommendations to repeat in 3 years -Father diatgnosed with colon cancer at 67 years old  Wants to get flu shot next [facility].   Past Medical History:  Diagnosis Date   Allergy    Hyperlipidemia    Hypertension    Wears contact lenses     Past Surgical History:  Procedure Laterality Date   COLONOSCOPY     COLONOSCOPY WITH PROPOFOL N/A 07/20/2016   Procedure: COLONOSCOPY WITH  PROPOFOL;  Surgeon: Lucilla Lame, MD;  Location: [facility];  Service: Endoscopy;  Laterality: N/A;   VASECTOMY      Family History  Problem Relation Age of Onset   Cancer Mother    Kidney disease Mother    Kidney disease Father    Prostate cancer Brother    Prostate cancer Brother    Kidney disease Brother    Diabetes Brother    Diabetes Brother    Diabetes Brother    Hypertension Maternal Grandmother    Hypertension Maternal Grandfather    Hypertension Paternal Grandmother    Hypertension Paternal Grandfather     Social History   Socioeconomic History   Marital status: Married    Spouse name: Not on file   Number of children: Not on file   Years of education: Not on file   Highest education level: Not on file  Occupational History   Not on file  Tobacco Use   Smoking status: Never   Smokeless tobacco: Never  Vaping Use   Vaping Use: Never used  Substance and Sexual Activity   Alcohol use: Yes    Alcohol/week: 2.0 standard drinks    Types: 2 Glasses of wine per week   Drug use: No   Sexual activity: Yes  Other Topics Concern   Not on file  Social History Narrative   Not on file   Social Determinants of Health   Financial Resource Strain: Not on file  Food Insecurity: Not on file  Transportation Needs: Not on file  Physical Activity: Not on file  Stress: Not on file  Social Connections: Not on file  Intimate Partner Violence: Not on file    Outpatient Medications Prior to Visit  Medication Sig Dispense Refill   atorvastatin (LIPITOR) 80 MG tablet Take 1 tablet (80 mg total) by mouth daily at 6 PM. 90 tablet 3   cetirizine (ZYRTEC) 10 MG tablet Take by mouth.     Cholecalciferol (VITAMIN D3) 5000 units CAPS Take 1 capsule by mouth daily.     co-enzyme Q-10 50 MG capsule Take 50 mg by mouth daily.     DHA-EPA-VITAMIN E PO Take 1 tablet by mouth daily.      lisinopril (ZESTRIL) 20 MG tablet Take 1 tablet (20 mg total) by mouth daily. 90 tablet 3    Multiple Vitamins-Minerals (MULTIVITAMIN ADULT PO) Take 1 capsule by mouth daily.     Omega-3 Fatty Acids (OMEGA 3 500) 500 MG CAPS Take 500 mg by mouth daily.     sildenafil (REVATIO) 20 MG tablet SMARTSIG:1-5 Tablet(s) By Mouth Daily PRN     testosterone cypionate (DEPOTESTOSTERONE CYPIONATE) 200 MG/ML injection Inject 1 mL into the muscle every 14 (fourteen) days.      No facility-administered medications prior to visit.    Allergies  Allergen Reactions   Latex Rash    Only if wears gloves for extended period    ROS Review of Systems  Constitutional:  Negative for chills and fever.  Eyes:  Negative for visual disturbance.  Respiratory:  Negative for cough and shortness of breath.   Cardiovascular:  Negative for chest pain and palpitations.  Gastrointestinal:  Negative for abdominal pain, blood in stool, constipation, diarrhea, nausea and vomiting.  Neurological:  Negative for dizziness and weakness.     Objective:    Physical Exam Constitutional:      Appearance: Normal appearance.  HENT:     Head: Normocephalic and atraumatic.  Eyes:     Conjunctiva/sclera: Conjunctivae normal.  Cardiovascular:     Rate and Rhythm: Normal rate and regular rhythm.  Pulmonary:     Effort: Pulmonary effort is normal.     Breath sounds: Normal breath sounds.  Abdominal:     General: There is no distension.     Palpations: Abdomen is soft.     Tenderness: There is no abdominal tenderness. There is no guarding.  Musculoskeletal:     Right lower leg: No edema.     Left lower leg: No edema.  Skin:    General: Skin is warm and dry.  Neurological:     General: No focal deficit present.     Mental Status: He is alert. Mental status is at baseline.  Psychiatric:        Mood and Affect: Mood normal.        Behavior: Behavior normal.    BP 134/86   Pulse 63   Temp 97.6 F (36.4 C)   Resp 16   Ht 6' 1"  (1.854 m)   Wt 252 lb 14.4 oz (114.7 kg)   SpO2 99%   BMI 33.37 kg/m  Wt  Readings from Last 3 Encounters:  09/10/20 250 lb 9.6 oz (113.7 kg)  08/01/19 250 lb (113.4 kg)  04/19/18 265 lb 4.8 oz (120.3 kg)     Health Maintenance Due  Topic Date Due   COLONOSCOPY (Pts 45-77yr Insurance coverage will need to be confirmed)  07/21/2019   COVID-19 Vaccine (3 - Booster for Pfizer series) 11/22/2019   INFLUENZA VACCINE  01/06/2021    There  are no preventive care reminders to display for this patient.  Lab Results  Component Value Date   TSH 0.918 09/10/2020   Lab Results  Component Value Date   WBC 4.6 09/10/2020   HGB 15.8 09/10/2020   HCT 47.8 09/10/2020   MCV 85 09/10/2020   PLT 212 09/10/2020   Lab Results  Component Value Date   NA 136 09/10/2020   K 4.7 09/10/2020   CO2 24 09/10/2020   GLUCOSE 78 09/10/2020   BUN 17 09/10/2020   CREATININE 1.34 (H) 09/10/2020   BILITOT 0.7 09/10/2020   ALKPHOS 67 09/10/2020   AST 42 (H) 09/10/2020   ALT 67 (H) 09/10/2020   PROT 6.8 09/10/2020   ALBUMIN 4.6 09/10/2020   CALCIUM 9.8 09/10/2020   EGFR 63 09/10/2020   Lab Results  Component Value Date   CHOL 129 09/10/2020   Lab Results  Component Value Date   HDL 46 09/10/2020   Lab Results  Component Value Date   LDLCALC 65 09/10/2020   Lab Results  Component Value Date   TRIG 92 09/10/2020   Lab Results  Component Value Date   CHOLHDL 2.8 09/10/2020   No results found for: HGBA1C    Assessment & Plan:   1. Essential hypertension, benign: Stable, Continue Lisinopril 20 mg daily and checking blood pressure at home. Recheck in 6 months.   - COMPLETE METABOLIC PANEL WITH GFR - CBC w/Diff/Platelet  2. Mixed hyperlipidemia: Stable, no changes to medications. Reviewed las lipid panel in April.   3. Elevated LFTs: Uncertain etiology, he is asymptomatic. He denies alcohol or medication use that affects the liver. He was checked for hepatitis in 2020 and was negative. A CMP will be repeated and iron studies (history of elevated hgb in past).    - COMPLETE METABOLIC PANEL WITH GFR - Fe+TIBC+Fer  4. Screening for colon cancer/Tubular adenoma: Due for colonoscopy, referral placed.   - Ambulatory referral to Gastroenterology  5. Hypotestosteronism: Stable, following with Urology for testosterone replacement.   Follow-up: Return in about 6 months (around 10/03/2021).    Teodora Medici, DO

## 2021-04-04 ENCOUNTER — Other Ambulatory Visit: Payer: Self-pay

## 2021-04-04 ENCOUNTER — Ambulatory Visit: Payer: Managed Care, Other (non HMO) | Admitting: Internal Medicine

## 2021-04-04 ENCOUNTER — Encounter: Payer: Self-pay | Admitting: Internal Medicine

## 2021-04-04 VITALS — BP 134/86 | HR 63 | Temp 97.6°F | Resp 16 | Ht 73.0 in | Wt 252.9 lb

## 2021-04-04 DIAGNOSIS — R7989 Other specified abnormal findings of blood chemistry: Secondary | ICD-10-CM | POA: Diagnosis not present

## 2021-04-04 DIAGNOSIS — E782 Mixed hyperlipidemia: Secondary | ICD-10-CM | POA: Diagnosis not present

## 2021-04-04 DIAGNOSIS — I10 Essential (primary) hypertension: Secondary | ICD-10-CM

## 2021-04-04 DIAGNOSIS — Z1211 Encounter for screening for malignant neoplasm of colon: Secondary | ICD-10-CM | POA: Diagnosis not present

## 2021-04-04 DIAGNOSIS — D369 Benign neoplasm, unspecified site: Secondary | ICD-10-CM

## 2021-04-04 DIAGNOSIS — E349 Endocrine disorder, unspecified: Secondary | ICD-10-CM

## 2021-04-04 NOTE — Patient Instructions (Addendum)
It was great seeing you today!  Plan discussed at today's visit: -Blood work ordered today, results will be uploaded to [facility].  -Referral to GI for colonoscopy -Continue all medications at current doses, recommend staying away from Tylenol and decreasing alcohol intake due to elevated liver enzymes  Follow up in: 6 months  Take care and let us know if you have any questions or concerns prior to your next visit.  Dr. Rosana Berger

## 2021-04-04 NOTE — Addendum Note (Signed)
Addended by: Docia Furl on: 04/04/2021 04:17 PM   Modules accepted: Orders

## 2021-04-05 LAB — COMPREHENSIVE METABOLIC PANEL
ALT: 50 IU/L — ABNORMAL HIGH (ref 0–44)
AST: 36 IU/L (ref 0–40)
Albumin/Globulin Ratio: 1.9 (ref 1.2–2.2)
Albumin: 4.7 g/dL (ref 3.8–4.9)
Alkaline Phosphatase: 71 IU/L (ref 44–121)
BUN/Creatinine Ratio: 12 (ref 9–20)
BUN: 19 mg/dL (ref 6–24)
Bilirubin Total: 0.7 mg/dL (ref 0.0–1.2)
CO2: 25 mmol/L (ref 20–29)
Calcium: 9.8 mg/dL (ref 8.7–10.2)
Chloride: 100 mmol/L (ref 96–106)
Creatinine, Ser: 1.63 mg/dL — ABNORMAL HIGH (ref 0.76–1.27)
Globulin, Total: 2.5 g/dL (ref 1.5–4.5)
Glucose: 78 mg/dL (ref 70–99)
Potassium: 4.5 mmol/L (ref 3.5–5.2)
Sodium: 138 mmol/L (ref 134–144)
Total Protein: 7.2 g/dL (ref 6.0–8.5)
eGFR: 49 mL/min/{1.73_m2} — ABNORMAL LOW (ref >59)

## 2021-04-05 LAB — CBC WITH DIFFERENTIAL/PLATELET
Basophils Absolute: 0 10*3/uL (ref 0.0–0.2)
Basos: 0 %
EOS (ABSOLUTE): 0.1 10*3/uL (ref 0.0–0.4)
Eos: 1 %
Hematocrit: 51.2 % — ABNORMAL HIGH (ref 37.5–51.0)
Hemoglobin: 17.1 g/dL (ref 13.0–17.7)
Immature Grans (Abs): 0 10*3/uL (ref 0.0–0.1)
Immature Granulocytes: 0 %
Lymphocytes Absolute: 1.8 10*3/uL (ref 0.7–3.1)
Lymphs: 38 %
MCH: 28.3 pg (ref 26.6–33.0)
MCHC: 33.4 g/dL (ref 31.5–35.7)
MCV: 85 fL (ref 79–97)
Monocytes Absolute: 0.6 10*3/uL (ref 0.1–0.9)
Monocytes: 12 %
Neutrophils Absolute: 2.2 10*3/uL (ref 1.4–7.0)
Neutrophils: 49 %
Platelets: 221 10*3/uL (ref 150–450)
RBC: 6.05 x10E6/uL — ABNORMAL HIGH (ref 4.14–5.80)
RDW: 13.9 % (ref 11.6–15.4)
WBC: 4.6 10*3/uL (ref 3.4–10.8)

## 2021-04-05 LAB — IRON,TIBC AND FERRITIN PANEL
Ferritin: 439 ng/mL — ABNORMAL HIGH (ref 30–400)
Iron Saturation: 23 % (ref 15–55)
Iron: 76 ug/dL (ref 38–169)
Total Iron Binding Capacity: 325 ug/dL (ref 250–450)
UIBC: 249 ug/dL (ref 111–343)

## 2021-06-11 ENCOUNTER — Ambulatory Visit: Payer: Self-pay | Admitting: Gastroenterology

## 2021-06-12 ENCOUNTER — Ambulatory Visit: Payer: Self-pay | Admitting: Gastroenterology

## 2021-07-17 ENCOUNTER — Ambulatory Visit (INDEPENDENT_AMBULATORY_CARE_PROVIDER_SITE_OTHER): Payer: Managed Care, Other (non HMO) | Admitting: Gastroenterology

## 2021-07-17 ENCOUNTER — Encounter: Payer: Self-pay | Admitting: Gastroenterology

## 2021-07-17 ENCOUNTER — Other Ambulatory Visit: Payer: Self-pay

## 2021-07-17 VITALS — BP 137/83 | HR 62 | Temp 97.6°F | Ht 73.0 in | Wt 258.6 lb

## 2021-07-17 DIAGNOSIS — R1319 Other dysphagia: Secondary | ICD-10-CM

## 2021-07-17 DIAGNOSIS — R748 Abnormal levels of other serum enzymes: Secondary | ICD-10-CM | POA: Diagnosis not present

## 2021-07-17 MED ORDER — NA SULFATE-K SULFATE-MG SULF 17.5-3.13-1.6 GM/177ML PO SOLN: 1.0000 | Freq: Once | ORAL | 0 refills | Status: AC

## 2021-07-17 NOTE — Progress Notes (Signed)
Gastroenterology Consultation  Referring Provider:     Teodora Medici, DO Primary Care Physician:  Delsa Grana, PA-C Primary Gastroenterologist:  Dr. Allen Norris     Reason for Consultation:     Abnormal liver enzymes        HPI:   Chad Hendricks is a 56 y.o. y/o male referred for consultation & management of abnormal liver enzymes by Dr. Delsa Grana, PA-C.  This patient comes in today after seeing me back in 2018 for colonoscopy.  At that time the patient had multiple polyps and the findings were of tubular adenomas.  The patient was recommended to have a repeat colonoscopy in 3 years.  The patient had a father who had colon cancer in his 25s.  The patient was noted to have abnormal liver enzymes by his primary care provider on blood work.  The blood work showed:  Component     Latest Ref Rng & Units 04/26/2018 01/10/2019 08/04/2019 09/10/2020  Total Bilirubin     0.0 - 1.2 mg/dL 0.4 0.6 0.6 0.7  Alkaline Phosphatase     44 - 121 IU/L 58 57 62 67  AST     0 - 40 IU/L 37 52 (H) 32 42 (H)  ALT     0 - 44 IU/L 45 (H) 59 (H) 47 (H) 67 (H)   Component     Latest Ref Rng & Units 04/04/2021  Total Bilirubin     0.0 - 1.2 mg/dL 0.7  Alkaline Phosphatase     44 - 121 IU/L 71  AST     0 - 40 IU/L 36  ALT     0 - 44 IU/L 50 (H)    As can be seen the patient has had abnormal liver enzymes dating back to at least 2019.  He denies any alcohol abuse.  The patient had a negative acute hepatitis panel in 2020.  The patient had a right upper quadrant ultrasound back in 2017 for abdominal pain that was normal. He takes a protein power. He denies NSAID's.  Past Medical History:  Diagnosis Date   Allergy    Hyperlipidemia    Hypertension    Wears contact lenses     Past Surgical History:  Procedure Laterality Date   COLONOSCOPY     COLONOSCOPY WITH PROPOFOL N/A 07/20/2016   Procedure: COLONOSCOPY WITH PROPOFOL;  Surgeon: Lucilla Lame, MD;  Location: [facility];  Service:  Endoscopy;  Laterality: N/A;   VASECTOMY      Prior to Admission medications   Medication Sig Start Date End Date Taking? Authorizing Provider  atorvastatin (LIPITOR) 80 MG tablet Take 1 tablet (80 mg total) by mouth daily at 6 PM. 08/26/20   Rumball, Jake Church, DO  cetirizine (ZYRTEC) 10 MG tablet Take by mouth.    [provider]  Cholecalciferol (VITAMIN D3) 5000 units CAPS Take 1 capsule by mouth daily. 09/27/08   [provider]  co-enzyme Q-10 50 MG capsule Take 50 mg by mouth daily.    [provider]  DHA-EPA-VITAMIN E PO Take 1 tablet by mouth daily.  11/03/07   [provider]  lisinopril (ZESTRIL) 20 MG tablet Take 1 tablet (20 mg total) by mouth daily. 08/26/20   Myles Gip, DO  Multiple Vitamins-Minerals (MULTIVITAMIN ADULT PO) Take 1 capsule by mouth daily. 11/03/07   [provider]  Omega-3 Fatty Acids (OMEGA 3 500) 500 MG CAPS Take 500 mg by mouth daily. 11/03/07  [provider]  sildenafil (REVATIO) 20 MG tablet SMARTSIG:1-5 Tablet(s) By Mouth Daily PRN 07/01/20   [provider]  testosterone cypionate (DEPOTESTOSTERONE CYPIONATE) 200 MG/ML injection Inject 1 mL into the muscle every 14 (fourteen) days.  12/11/18   [provider]    Family History  Problem Relation Age of Onset   Cancer Mother    Kidney disease Mother    Kidney disease Father    Prostate cancer Brother    Prostate cancer Brother    Kidney disease Brother    Diabetes Brother    Diabetes Brother    Diabetes Brother    Hypertension Maternal Grandmother    Hypertension Maternal Grandfather    Hypertension Paternal Grandmother    Hypertension Paternal Grandfather      Social History   Tobacco Use   Smoking status: Never   Smokeless tobacco: Never  Vaping Use   Vaping Use: Never used  Substance Use Topics   Alcohol use: Yes    Alcohol/week: 2.0 standard drinks    Types: 2 Glasses of wine per week   Drug use: No     Allergies as of 07/17/2021 - Review Complete 04/04/2021  Allergen Reaction Noted   Latex Rash 12/17/2014    Review of Systems:    All systems reviewed and negative except where noted in HPI.   Physical Exam:  There were no vitals taken for this visit. No LMP for male patient. General:   Alert,  Well-developed, well-nourished, pleasant and cooperative in NAD Head:  Normocephalic and atraumatic. Eyes:  Sclera clear, no icterus.   Conjunctiva pink. Ears:  Normal auditory acuity. Neck:  Supple; no masses or thyromegaly. Lungs:  Respirations even and unlabored.  Clear throughout to auscultation.   No wheezes, crackles, or rhonchi. No acute distress. Heart:  Regular rate and rhythm; no murmurs, clicks, rubs, or gallops. Abdomen:  Normal bowel sounds.  No bruits.  Soft, non-tender and non-distended without masses, hepatosplenomegaly or hernias noted.  No guarding or rebound tenderness.   egative Carnett sign.   Rectal:  Deferred.  Pulses:  Normal pulses noted. Extremities:  No clubbing or edema.  No cyanosis. Neurologic:  Alert and oriented x3;  grossly normal neurologically. Skin:  Intact without significant lesions or rashes.  No jaundice. Lymph Nodes:  No significant cervical adenopathy. Psych:  Alert and cooperative. Normal mood and affect.  Imaging Studies: No results found.  Assessment and Plan:   ASAF ELMQUIST is a 56 y.o. y/o male who comes in with a history of colon polyps and is in need of a repeat colonoscopy.  The patient also has dysphagia to solid foods.  The patient will be set up for an EGD and colonoscopy.  The patient will also have lab sent off to look for possible cause of his abnormal liver enzymes.  The patient will also be set up for right upper quadrant ultrasound of the liver.  The patient has been explained the plan agrees with it.    Lucilla Lame, MD. Marval Regal    Note: This dictation was prepared with Dragon dictation along with smaller phrase  technology. Any transcriptional errors that result from this process are unintentional.

## 2021-07-17 NOTE — H&P (View-Only) (Signed)
Gastroenterology Consultation  Referring Provider:     Teodora Medici, DO Primary Care Physician:  Delsa Grana, PA-C Primary Gastroenterologist:  Dr. Allen Norris     Reason for Consultation:     Abnormal liver enzymes        HPI:   Chad Hendricks is a 56 y.o. y/o male referred for consultation & management of abnormal liver enzymes by Dr. Delsa Grana, PA-C.  This patient comes in today after seeing me back in 2018 for colonoscopy.  At that time the patient had multiple polyps and the findings were of tubular adenomas.  The patient was recommended to have a repeat colonoscopy in 3 years.  The patient had a father who had colon cancer in his 4s.  The patient was noted to have abnormal liver enzymes by his primary care provider on blood work.  The blood work showed:  Component     Latest Ref Rng & Units 04/26/2018 01/10/2019 08/04/2019 09/10/2020  Total Bilirubin     0.0 - 1.2 mg/dL 0.4 0.6 0.6 0.7  Alkaline Phosphatase     44 - 121 IU/L 58 57 62 67  AST     0 - 40 IU/L 37 52 (H) 32 42 (H)  ALT     0 - 44 IU/L 45 (H) 59 (H) 47 (H) 67 (H)   Component     Latest Ref Rng & Units 04/04/2021  Total Bilirubin     0.0 - 1.2 mg/dL 0.7  Alkaline Phosphatase     44 - 121 IU/L 71  AST     0 - 40 IU/L 36  ALT     0 - 44 IU/L 50 (H)    As can be seen the patient has had abnormal liver enzymes dating back to at least 2019.  He denies any alcohol abuse.  The patient had a negative acute hepatitis panel in 2020.  The patient had a right upper quadrant ultrasound back in 2017 for abdominal pain that was normal. He takes a protein power. He denies NSAID's.  Past Medical History:  Diagnosis Date   Allergy    Hyperlipidemia    Hypertension    Wears contact lenses     Past Surgical History:  Procedure Laterality Date   COLONOSCOPY     COLONOSCOPY WITH PROPOFOL N/A 07/20/2016   Procedure: COLONOSCOPY WITH PROPOFOL;  Surgeon: Lucilla Lame, MD;  Location: [facility];  Service:  Endoscopy;  Laterality: N/A;   VASECTOMY      Prior to Admission medications   Medication Sig Start Date End Date Taking? Authorizing Provider  atorvastatin (LIPITOR) 80 MG tablet Take 1 tablet (80 mg total) by mouth daily at 6 PM. 08/26/20   Rumball, Jake Church, DO  cetirizine (ZYRTEC) 10 MG tablet Take by mouth.    [provider]  Cholecalciferol (VITAMIN D3) 5000 units CAPS Take 1 capsule by mouth daily. 09/27/08   [provider]  co-enzyme Q-10 50 MG capsule Take 50 mg by mouth daily.    [provider]  DHA-EPA-VITAMIN E PO Take 1 tablet by mouth daily.  11/03/07   [provider]  lisinopril (ZESTRIL) 20 MG tablet Take 1 tablet (20 mg total) by mouth daily. 08/26/20   Myles Gip, DO  Multiple Vitamins-Minerals (MULTIVITAMIN ADULT PO) Take 1 capsule by mouth daily. 11/03/07   [provider]  Omega-3 Fatty Acids (OMEGA 3 500) 500 MG CAPS Take 500 mg by mouth daily. 11/03/07  [provider]  sildenafil (REVATIO) 20 MG tablet SMARTSIG:1-5 Tablet(s) By Mouth Daily PRN 07/01/20   [provider]  testosterone cypionate (DEPOTESTOSTERONE CYPIONATE) 200 MG/ML injection Inject 1 mL into the muscle every 14 (fourteen) days.  12/11/18   [provider]    Family History  Problem Relation Age of Onset   Cancer Mother    Kidney disease Mother    Kidney disease Father    Prostate cancer Brother    Prostate cancer Brother    Kidney disease Brother    Diabetes Brother    Diabetes Brother    Diabetes Brother    Hypertension Maternal Grandmother    Hypertension Maternal Grandfather    Hypertension Paternal Grandmother    Hypertension Paternal Grandfather      Social History   Tobacco Use   Smoking status: Never   Smokeless tobacco: Never  Vaping Use   Vaping Use: Never used  Substance Use Topics   Alcohol use: Yes    Alcohol/week: 2.0 standard drinks    Types: 2 Glasses of wine per week   Drug use: No     Allergies as of 07/17/2021 - Review Complete 04/04/2021  Allergen Reaction Noted   Latex Rash 12/17/2014    Review of Systems:    All systems reviewed and negative except where noted in HPI.   Physical Exam:  There were no vitals taken for this visit. No LMP for male patient. General:   Alert,  Well-developed, well-nourished, pleasant and cooperative in NAD Head:  Normocephalic and atraumatic. Eyes:  Sclera clear, no icterus.   Conjunctiva pink. Ears:  Normal auditory acuity. Neck:  Supple; no masses or thyromegaly. Lungs:  Respirations even and unlabored.  Clear throughout to auscultation.   No wheezes, crackles, or rhonchi. No acute distress. Heart:  Regular rate and rhythm; no murmurs, clicks, rubs, or gallops. Abdomen:  Normal bowel sounds.  No bruits.  Soft, non-tender and non-distended without masses, hepatosplenomegaly or hernias noted.  No guarding or rebound tenderness.   egative Carnett sign.   Rectal:  Deferred.  Pulses:  Normal pulses noted. Extremities:  No clubbing or edema.  No cyanosis. Neurologic:  Alert and oriented x3;  grossly normal neurologically. Skin:  Intact without significant lesions or rashes.  No jaundice. Lymph Nodes:  No significant cervical adenopathy. Psych:  Alert and cooperative. Normal mood and affect.  Imaging Studies: No results found.  Assessment and Plan:   Chad Hendricks is a 56 y.o. y/o male who comes in with a history of colon polyps and is in need of a repeat colonoscopy.  The patient also has dysphagia to solid foods.  The patient will be set up for an EGD and colonoscopy.  The patient will also have lab sent off to look for possible cause of his abnormal liver enzymes.  The patient will also be set up for right upper quadrant ultrasound of the liver.  The patient has been explained the plan agrees with it.    Lucilla Lame, MD. Marval Regal    Note: This dictation was prepared with Dragon dictation along with smaller phrase  technology. Any transcriptional errors that result from this process are unintentional.

## 2021-07-17 NOTE — Addendum Note (Signed)
Addended by: Lurlean Nanny on: 07/17/2021 05:27 PM   Modules accepted: Orders

## 2021-07-17 NOTE — Patient Instructions (Signed)
I will call you before 5pm today with the ultrasound appointment.  If you will need to cancel or reschedule, you can call [phone].

## 2021-07-20 LAB — IRON,TIBC AND FERRITIN PANEL
Ferritin: 501 ng/mL — ABNORMAL HIGH (ref 30–400)
Iron Saturation: 24 % (ref 15–55)
Iron: 70 ug/dL (ref 38–169)
Total Iron Binding Capacity: 289 ug/dL (ref 250–450)
UIBC: 219 ug/dL (ref 111–343)

## 2021-07-20 LAB — HEPATITIS B SURFACE ANTIGEN: Hepatitis B Surface Ag: NEGATIVE

## 2021-07-20 LAB — HEPATIC FUNCTION PANEL
ALT: 50 IU/L — ABNORMAL HIGH (ref 0–44)
AST: 34 IU/L (ref 0–40)
Albumin: 4.2 g/dL (ref 3.8–4.9)
Alkaline Phosphatase: 72 IU/L (ref 44–121)
Bilirubin Total: 0.4 mg/dL (ref 0.0–1.2)
Bilirubin, Direct: 0.14 mg/dL (ref 0.00–0.40)
Total Protein: 7.3 g/dL (ref 6.0–8.5)

## 2021-07-20 LAB — GAMMA GT: GGT: 28 IU/L (ref 0–65)

## 2021-07-20 LAB — HEPATITIS A ANTIBODY, TOTAL: hep A Total Ab: NEGATIVE

## 2021-07-20 LAB — ALPHA-1-ANTITRYPSIN: A-1 Antitrypsin: 107 mg/dL (ref 101–187)

## 2021-07-20 LAB — MITOCHONDRIAL ANTIBODIES: Mitochondrial Ab: <20 Units (ref 0.0–20.0)

## 2021-07-20 LAB — HEPATITIS B SURFACE ANTIBODY,QUALITATIVE: Hep B Surface Ab, Qual: REACTIVE

## 2021-07-20 LAB — ANTI-SMOOTH MUSCLE ANTIBODY, IGG: Smooth Muscle Ab: <1 Units (ref 0–19)

## 2021-07-20 LAB — CERULOPLASMIN: Ceruloplasmin: 21.8 mg/dL (ref 16.0–31.0)

## 2021-07-22 ENCOUNTER — Ambulatory Visit
Admission: RE | Admit: 2021-07-22 | Discharge: 2021-07-22 | Disposition: A | Discharge status: Home / Self Care | Payer: Managed Care, Other (non HMO) | Source: Ambulatory Visit | Attending: Gastroenterology | Admitting: Gastroenterology

## 2021-07-22 ENCOUNTER — Other Ambulatory Visit: Payer: Self-pay

## 2021-07-22 DIAGNOSIS — R748 Abnormal levels of other serum enzymes: Secondary | ICD-10-CM | POA: Diagnosis present

## 2021-08-07 ENCOUNTER — Other Ambulatory Visit: Payer: Self-pay

## 2021-08-07 ENCOUNTER — Encounter: Payer: Self-pay | Admitting: Gastroenterology

## 2021-08-15 ENCOUNTER — Ambulatory Visit: Payer: Managed Care, Other (non HMO) | Admitting: Anesthesiology

## 2021-08-15 ENCOUNTER — Encounter: Payer: Self-pay | Admitting: Gastroenterology

## 2021-08-15 ENCOUNTER — Encounter
Admission: RE | Disposition: A | Discharge status: Home / Self Care | Payer: Self-pay | Source: Home / Self Care | Attending: Gastroenterology

## 2021-08-15 ENCOUNTER — Ambulatory Visit
Admission: RE | Admit: 2021-08-15 | Discharge: 2021-08-15 | Disposition: A | Discharge status: Home / Self Care | Payer: Managed Care, Other (non HMO) | Attending: Gastroenterology | Admitting: Gastroenterology

## 2021-08-15 ENCOUNTER — Other Ambulatory Visit: Payer: Self-pay

## 2021-08-15 DIAGNOSIS — Z860100 Personal history of colon polyps, unspecified: Secondary | ICD-10-CM

## 2021-08-15 DIAGNOSIS — Z800 Family history of malignant neoplasm of digestive organs: Secondary | ICD-10-CM | POA: Insufficient documentation

## 2021-08-15 DIAGNOSIS — R748 Abnormal levels of other serum enzymes: Secondary | ICD-10-CM | POA: Insufficient documentation

## 2021-08-15 DIAGNOSIS — K648 Other hemorrhoids: Secondary | ICD-10-CM | POA: Insufficient documentation

## 2021-08-15 DIAGNOSIS — R1319 Other dysphagia: Secondary | ICD-10-CM

## 2021-08-15 DIAGNOSIS — Z1211 Encounter for screening for malignant neoplasm of colon: Secondary | ICD-10-CM | POA: Insufficient documentation

## 2021-08-15 DIAGNOSIS — I10 Essential (primary) hypertension: Secondary | ICD-10-CM | POA: Insufficient documentation

## 2021-08-15 DIAGNOSIS — K635 Polyp of colon: Secondary | ICD-10-CM

## 2021-08-15 DIAGNOSIS — D120 Benign neoplasm of cecum: Secondary | ICD-10-CM | POA: Insufficient documentation

## 2021-08-15 DIAGNOSIS — R1310 Dysphagia, unspecified: Secondary | ICD-10-CM | POA: Insufficient documentation

## 2021-08-15 DIAGNOSIS — K5730 Diverticulosis of large intestine without perforation or abscess without bleeding: Secondary | ICD-10-CM | POA: Insufficient documentation

## 2021-08-15 HISTORY — DX: Other seasonal allergic rhinitis: J30.2

## 2021-08-15 HISTORY — PX: BALLOON DILATION: SHX5330

## 2021-08-15 HISTORY — PX: POLYPECTOMY: SHX5525

## 2021-08-15 HISTORY — PX: ESOPHAGOGASTRODUODENOSCOPY (EGD) WITH PROPOFOL: SHX5813

## 2021-08-15 HISTORY — PX: COLONOSCOPY WITH PROPOFOL: SHX5780

## 2021-08-15 SURGERY — COLONOSCOPY WITH PROPOFOL
Anesthesia: General | Site: Rectum

## 2021-08-15 MED ORDER — STERILE WATER FOR IRRIGATION IR SOLN
Status: DC | PRN
  Administered 2021-08-15: 1000 mL

## 2021-08-15 MED ORDER — ACETAMINOPHEN 160 MG/5ML PO SOLN: 975.0000 mg | Freq: Once | ORAL | Status: DC | PRN

## 2021-08-15 MED ORDER — LIDOCAINE HCL (CARDIAC) PF 100 MG/5ML IV SOSY
PREFILLED_SYRINGE | INTRAVENOUS | Status: DC | PRN
  Administered 2021-08-15: 100 mg via INTRAVENOUS

## 2021-08-15 MED ORDER — GLYCOPYRROLATE 0.2 MG/ML IJ SOLN
INTRAMUSCULAR | Status: DC | PRN
Start: 2021-08-15 — End: 2021-08-15
  Administered 2021-08-15: .2 mg via INTRAVENOUS

## 2021-08-15 MED ORDER — ONDANSETRON HCL 4 MG/2ML IJ SOLN: 4.0000 mg | Freq: Once | INTRAMUSCULAR | Status: DC | PRN

## 2021-08-15 MED ORDER — SODIUM CHLORIDE 0.9 % IV SOLN: INTRAVENOUS | Status: DC

## 2021-08-15 MED ORDER — LACTATED RINGERS IV SOLN: INTRAVENOUS | Status: DC

## 2021-08-15 MED ORDER — STERILE WATER FOR IRRIGATION IR SOLN
Status: DC | PRN
  Administered 2021-08-15: 50 mL

## 2021-08-15 MED ORDER — ACETAMINOPHEN 500 MG PO TABS: 1000.0000 mg | ORAL_TABLET | Freq: Once | ORAL | Status: DC | PRN

## 2021-08-15 MED ORDER — PROPOFOL 10 MG/ML IV BOLUS
INTRAVENOUS | Status: DC | PRN
  Administered 2021-08-15: 50 mg via INTRAVENOUS
  Administered 2021-08-15 (×2): 100 mg via INTRAVENOUS

## 2021-08-15 SURGICAL SUPPLY — 38 items
BALLN DILATOR 10-12 8 (BALLOONS)
BALLN DILATOR 12-15 8 (BALLOONS)
BALLN DILATOR 15-18 8 (BALLOONS) ×5
BALLN DILATOR CRE 0-12 8 (BALLOONS)
BALLN DILATOR ESOPH 8 10 CRE (MISCELLANEOUS) IMPLANT
BALLOON DILATOR 12-15 8 (BALLOONS) IMPLANT
BALLOON DILATOR 15-18 8 (BALLOONS) IMPLANT
BALLOON DILATOR CRE 0-12 8 (BALLOONS) IMPLANT
BLOCK BITE 60FR ADLT L/F GRN (MISCELLANEOUS) ×5 IMPLANT
CLIP HMST 235XBRD CATH ROT (MISCELLANEOUS) IMPLANT
CLIP RESOLUTION 360 11X235 (MISCELLANEOUS)
ELECT REM PT RETURN 9FT ADLT (ELECTROSURGICAL)
ELECTRODE REM PT RTRN 9FT ADLT (ELECTROSURGICAL) IMPLANT
FCP ESCP3.2XJMB 240X2.8X (MISCELLANEOUS)
FORCEPS BIOP RAD 4 LRG CAP 4 (CUTTING FORCEPS) IMPLANT
FORCEPS BIOP RJ4 240 W/NDL (MISCELLANEOUS)
FORCEPS ESCP3.2XJMB 240X2.8X (MISCELLANEOUS) IMPLANT
GOWN CVR UNV OPN BCK APRN NK (MISCELLANEOUS) ×6 IMPLANT
GOWN ISOL THUMB LOOP REG UNIV (MISCELLANEOUS) ×10
INJECTOR VARIJECT VIN23 (MISCELLANEOUS) IMPLANT
KIT DEFENDO VALVE AND CONN (KITS) IMPLANT
KIT PRC NS LF DISP ENDO (KITS) ×3 IMPLANT
KIT PROCEDURE OLYMPUS (KITS) ×5
MANIFOLD NEPTUNE II (INSTRUMENTS) ×5 IMPLANT
MARKER SPOT ENDO TATTOO 5ML (MISCELLANEOUS) IMPLANT
PROBE APC STR FIRE (PROBE) IMPLANT
RETRIEVER NET PLAT FOOD (MISCELLANEOUS) IMPLANT
RETRIEVER NET ROTH 2.5X230 LF (MISCELLANEOUS) IMPLANT
SNARE COLD EXACTO (MISCELLANEOUS) IMPLANT
SNARE SHORT THROW 13M SML OVAL (MISCELLANEOUS) ×2 IMPLANT
SNARE SHORT THROW 30M LRG OVAL (MISCELLANEOUS) IMPLANT
SNARE SNG USE RND 15MM (INSTRUMENTS) IMPLANT
SPOT EX ENDOSCOPIC TATTOO (MISCELLANEOUS)
SYR INFLATION 60ML (SYRINGE) ×2 IMPLANT
TRAP ETRAP POLY (MISCELLANEOUS) ×2 IMPLANT
VARIJECT INJECTOR VIN23 (MISCELLANEOUS)
WATER STERILE IRR 250ML POUR (IV SOLUTION) ×5 IMPLANT
WIRE CRE 18-20MM 8CM F G (MISCELLANEOUS) IMPLANT

## 2021-08-15 NOTE — Anesthesia Postprocedure Evaluation (Signed)
Anesthesia Post Note ? ?Patient: Chad Hendricks ? ?Procedure(s) Performed: COLONOSCOPY WITH PROPOFOL (Rectum) ?ESOPHAGOGASTRODUODENOSCOPY (EGD) WITH PROPOFOL (Mouth) ?BALLOON DILATION (Esophagus) ?POLYPECTOMY (Rectum) ? ? ?  ?Patient location during evaluation: PACU ?Anesthesia Type: General ?Level of consciousness: awake and alert ?Pain management: pain level controlled ?Vital Signs Assessment: post-procedure vital signs reviewed and stable ?Respiratory status: spontaneous breathing, nonlabored ventilation and respiratory function stable ?Cardiovascular status: blood pressure returned to baseline and stable ?Postop Assessment: no apparent nausea or vomiting ?Anesthetic complications: no ? ? ?No notable events documented. ? ?April Manson ? ? ? ? ? ?

## 2021-08-15 NOTE — Anesthesia Preprocedure Evaluation (Signed)
Anesthesia Evaluation  ?Patient identified by MRN, date of birth, ID band ?Patient awake ? ? ? ?Reviewed: ?Allergy & Precautions, H&P , NPO status , Patient's Chart, lab work & pertinent test results, reviewed documented beta blocker date and time  ? ?Airway ?Mallampati: II ? ?TM Distance: >3 FB ?Neck ROM: full ? ? ? Dental ?no notable dental hx. ? ?  ?Pulmonary ?neg pulmonary ROS,  ?  ?Pulmonary exam normal ?breath sounds clear to auscultation ? ? ? ? ? ? Cardiovascular ?Exercise Tolerance: Good ?hypertension, negative cardio ROS ? ? ?Rhythm:regular Rate:Normal ? ? ?  ?Neuro/Psych ?negative neurological ROS ? negative psych ROS  ? GI/Hepatic ?negative GI ROS, Neg liver ROS,   ?Endo/Other  ?negative endocrine ROS ? Renal/GU ?negative Renal ROS  ?negative genitourinary ?  ?Musculoskeletal ? ? Abdominal ?  ?Peds ? Hematology ?negative hematology ROS ?(+)   ?Anesthesia Other Findings ? ? Reproductive/Obstetrics ?negative OB ROS ? ?  ? ? ? ? ? ? ? ? ? ? ? ? ? ?  ?  ? ? ? ? ? ? ? ? ?Anesthesia Physical ?Anesthesia Plan ? ?ASA: 2 ? ?Anesthesia Plan: General  ? ?Post-op Pain Management:   ? ?Induction:  ? ?PONV Risk Score and Plan: 2 and Propofol infusion, TIVA and Treatment may vary due to age or medical condition ? ?Airway Management Planned:  ? ?Additional Equipment:  ? ?Intra-op Plan:  ? ?Post-operative Plan:  ? ?Informed Consent: I have reviewed the patients History and Physical, chart, labs and discussed the procedure including the risks, benefits and alternatives for the proposed anesthesia with the patient or authorized representative who has indicated his/her understanding and acceptance.  ? ? ? ?Dental Advisory Given ? ?Plan Discussed with: CRNA ? ?Anesthesia Plan Comments:   ? ? ? ? ? ? ?Anesthesia Quick Evaluation ? ?

## 2021-08-15 NOTE — Interval H&P Note (Signed)
? ?Chad Lame, MD [facility] ?[address] ?[place], [place] [address] ?[phone] ?Fax : [phone] ? ?Primary Care Physician:  Chad Grana, PA-C ?Primary Gastroenterologist:  Dr. Allen Hendricks ? ?Pre-Procedure History & Physical: ?HPI:  Chad Hendricks is a 56 y.o. male is here for an endoscopy and colonoscopy. ?  ?Past Medical History:  ?Diagnosis Date  ? Allergy   ? Hyperlipidemia   ? Hypertension   ? Seasonal allergies   ? Wears contact lenses   ? ? ?Past Surgical History:  ?Procedure Laterality Date  ? COLONOSCOPY    ? COLONOSCOPY WITH PROPOFOL N/A 07/20/2016  ? Procedure: COLONOSCOPY WITH PROPOFOL;  Surgeon: Chad Lame, MD;  Location: [facility];  Service: Endoscopy;  Laterality: N/A;  ? VASECTOMY    ? ? ?Prior to Admission medications   ?Medication Sig Start Date End Date Taking? Authorizing Provider  ?atorvastatin (LIPITOR) 80 MG tablet Take 1 tablet (80 mg total) by mouth daily at 6 PM. 08/26/20   Myles Gip, DO  ?cetirizine (ZYRTEC) 10 MG tablet Take by mouth.    [provider]  ?Cholecalciferol (VITAMIN D3) 5000 units CAPS Take 1 capsule by mouth daily. 09/27/08   [provider]  ?co-enzyme Q-10 50 MG capsule Take 50 mg by mouth daily.    [provider]  ?DHA-EPA-VITAMIN E PO Take 1 tablet by mouth daily.  ?Patient not taking: Reported on 08/07/2021 11/03/07   [provider]  ?lisinopril (ZESTRIL) 20 MG tablet Take 1 tablet (20 mg total) by mouth daily. 08/26/20   Myles Gip, DO  ?Multiple Vitamins-Minerals (MULTIVITAMIN ADULT PO) Take 1 capsule by mouth daily. 11/03/07   [provider]  ?Omega-3 Fatty Acids (OMEGA 3 500) 500 MG CAPS Take 500 mg by mouth daily. 11/03/07   [provider]  ?sildenafil (REVATIO) 20 MG tablet SMARTSIG:1-5 Tablet(s) By Mouth Daily PRN 07/01/20   [provider]  ?testosterone cypionate (DEPOTESTOSTERONE CYPIONATE) 200 MG/ML injection Inject 1 mL into the muscle every 14 (fourteen)  days.  12/11/18   [provider]  ? ? ?Allergies as of 07/17/2021 - Review Complete 07/17/2021  ?Allergen Reaction Noted  ? Latex Rash 12/17/2014  ? ? ?Family History  ?Problem Relation Age of Onset  ? Cancer Mother   ? Kidney disease Mother   ? Kidney disease Father   ? Prostate cancer Brother   ? Prostate cancer Brother   ? Kidney disease Brother   ? Diabetes Brother   ? Diabetes Brother   ? Diabetes Brother   ? Hypertension Maternal Grandmother   ? Hypertension Maternal Grandfather   ? Hypertension Paternal Grandmother   ? Hypertension Paternal Grandfather   ? ? ?Social History  ? ?Socioeconomic History  ? Marital status: Married  ?  Spouse name: Not on file  ? Number of children: Not on file  ? Years of education: Not on file  ? Highest education level: Not on file  ?Occupational History  ? Not on file  ?Tobacco Use  ? Smoking status: Never  ? Smokeless tobacco: Never  ?Vaping Use  ? Vaping Use: Never used  ?Substance and Sexual Activity  ? Alcohol use: Yes  ?  Alcohol/week: 1.0 standard drink  ?  Types: 1 Glasses of wine per week  ? Drug use: No  ? Sexual activity: Yes  ?Other Topics Concern  ? Not on file  ?Social History Narrative  ? Not on file  ? ?Social Determinants of Health  ? ?  Financial Resource Strain: Not on file  ?Food Insecurity: Not on file  ?Transportation Needs: Not on file  ?Physical Activity: Not on file  ?Stress: Not on file  ?Social Connections: Not on file  ?Intimate Partner Violence: Not on file  ? ? ?Review of Systems: ?See HPI, otherwise negative ROS ? ?Physical Exam: ?Ht '6\' 1"'$  (1.854 m)   Wt 115.7 kg   BMI 33.64 kg/m?  ?General:   Alert,  pleasant and cooperative in NAD ?Head:  Normocephalic and atraumatic. ?Neck:  Supple; no masses or thyromegaly. ?Lungs:  Clear throughout to auscultation.    ?Heart:  Regular rate and rhythm. ?Abdomen:  Soft, nontender and nondistended. Normal bowel sounds, without guarding, and without rebound.   ?Neurologic:  Alert and  oriented x4;  grossly  normal neurologically. ? ?Impression/Plan: ?Chad Hendricks is here for an endoscopy and colonoscopy to be performed for a history of adenomatous polyps on 2018 and dysphagia ? ? ?Risks, benefits, limitations, and alternatives regarding  endoscopy and colonoscopy have been reviewed with the patient.  Questions have been answered.  All parties agreeable. ? ? ?Chad Lame, MD  08/15/2021, 10:02 AM ?

## 2021-08-15 NOTE — Transfer of Care (Signed)
Immediate Anesthesia Transfer of Care Note ? ?Patient: Chad Hendricks ? ?Procedure(s) Performed: COLONOSCOPY WITH PROPOFOL (Rectum) ?ESOPHAGOGASTRODUODENOSCOPY (EGD) WITH PROPOFOL (Mouth) ?BALLOON DILATION (Esophagus) ?POLYPECTOMY (Rectum) ? ?Patient Location: PACU ? ?Anesthesia Type: No value filed. ? ?Level of Consciousness: awake, alert  and patient cooperative ? ?Airway and Oxygen Therapy: Patient Spontanous Breathing and Patient connected to supplemental oxygen ? ?Post-op Assessment: Post-op Vital signs reviewed, Patient's Cardiovascular Status Stable, Respiratory Function Stable, Patent Airway and No signs of Nausea or vomiting ? ?Post-op Vital Signs: Reviewed and stable ? ?Complications: No notable events documented. ? ?

## 2021-08-15 NOTE — Anesthesia Procedure Notes (Signed)
Procedure Name: General with mask airway ?Date/Time: 08/15/2021 10:45 AM ?Performed by: Jeannene Patella, CRNA ?Pre-anesthesia Checklist: Patient identified, Emergency Drugs available, Suction available, Timeout performed and Patient being monitored ?Patient Re-evaluated:Patient Re-evaluated prior to induction ?Oxygen Delivery Method: Nasal cannula ?Placement Confirmation: positive ETCO2 ? ? ? ? ?

## 2021-08-15 NOTE — Op Note (Signed)
[facility] ?Gastroenterology ?Patient Name: Chad Hendricks ?Procedure Date: 08/15/2021 10:07 AM ?MRN: [id number] ?Account #: [id number] ?Date of Birth: [birth year] ?Admit Type: Outpatient ?Age: 56 ?Room: [facility] OR ROOM 01 ?Gender: Male ?Note Status: Finalized ?Instrument Name: [id number] ?Procedure:             Colonoscopy ?Indications:           High risk colon cancer surveillance: Personal history  ?                       of colonic polyps ?Providers:             Lucilla Lame MD, MD ?Referring MD:          Delsa Grana (Referring MD) ?Medicines:             Propofol per Anesthesia ?Complications:         No immediate complications. ?Procedure:             Pre-Anesthesia Assessment: ?                       - Prior to the procedure, a History and Physical was  ?                       performed, and patient medications and allergies were  ?                       reviewed. The patient's tolerance of previous  ?                       anesthesia was also reviewed. The risks and benefits  ?                       of the procedure and the sedation options and risks  ?                       were discussed with the patient. All questions were  ?                       answered, and informed consent was obtained. Prior  ?                       Anticoagulants: The patient has taken no previous  ?                       anticoagulant or antiplatelet agents. ASA Grade  ?                       Assessment: II - A patient with mild systemic disease.  ?                       After reviewing the risks and benefits, the patient  ?                       was deemed in satisfactory condition to undergo the  ?                       procedure. ?                       -  Prior to the procedure, a History and Physical was  ?                       performed, and patient medications and allergies were  ?                       reviewed. The patient's tolerance of previous  ?                       anesthesia was also reviewed. The risks  and benefits  ?                       of the procedure and the sedation options and risks  ?                       were discussed with the patient. All questions were  ?                       answered, and informed consent was obtained. Prior  ?                       Anticoagulants: The patient has taken no previous  ?                       anticoagulant or antiplatelet agents. ASA Grade  ?                       Assessment: II - A patient with mild systemic disease.  ?                       After reviewing the risks and benefits, the patient  ?                       was deemed in satisfactory condition to undergo the  ?                       procedure. ?                       After obtaining informed consent, the colonoscope was  ?                       passed under direct vision. Throughout the procedure,  ?                       the patient's blood pressure, pulse, and oxygen  ?                       saturations were monitored continuously. The  ?                       Colonoscope was introduced through the anus and  ?                       advanced to the the cecum, identified by appendiceal  ?                       orifice and ileocecal valve. The colonoscopy was  ?  performed without difficulty. The patient tolerated  ?                       the procedure well. The quality of the bowel  ?                       preparation was excellent. ?Findings: ?     The perianal and digital rectal examinations were normal. ?     A 5 mm polyp was found in the cecum. The polyp was sessile. The polyp  ?     was removed with a cold snare. Resection and retrieval were complete. ?     Non-bleeding internal hemorrhoids were found during retroflexion. The  ?     hemorrhoids were Grade II (internal hemorrhoids that prolapse but reduce  ?     spontaneously). ?     Multiple small-mouthed diverticula were found in the entire colon. ?Impression:            - One 5 mm polyp in the cecum, removed with a cold  ?                        snare. Resected and retrieved. ?                       - Non-bleeding internal hemorrhoids. ?                       - Diverticulosis in the entire examined colon. ?Recommendation:        - Discharge patient to home. ?                       - Resume previous diet. ?                       - Continue present medications. ?                       - Await pathology results. ?                       - Repeat colonoscopy in 7 years for surveillance. ?Procedure Code(s):     --- Professional --- ?                       [phone], Colonoscopy, flexible; with removal of  ?                       tumor(s), polyp(s), or other lesion(s) by snare  ?                       technique ?Diagnosis Code(s):     --- Professional --- ?                       Z86.010, Personal history of colonic polyps ?                       K63.5, Polyp of colon ?CPT copyright 2019 American Medical Association. All rights reserved. ?The codes documented in this report are preliminary and upon coder review may  ?be revised to meet current compliance requirements. ?Lucilla Lame MD, MD ?08/15/2021 10:59:31 AM ?This report has  been signed electronically. ?Number of Addenda: 0 ?Note Initiated On: 08/15/2021 10:07 AM ?Scope Withdrawal Time: 0 hours 12 minutes 19 seconds  ?Total Procedure Duration: 0 hours 14 minutes 30 seconds  ?Estimated Blood Loss:  Estimated blood loss: none. ?     [facility] ?

## 2021-08-15 NOTE — Op Note (Signed)
[facility] ?Gastroenterology ?Patient Name: Chad Hendricks ?Procedure Date: 08/15/2021 10:08 AM ?MRN: [id number] ?Account #: [id number] ?Date of Birth: [birth year] ?Admit Type: Outpatient ?Age: 56 ?Room: [facility] OR ROOM 01 ?Gender: Male ?Note Status: Finalized ?Instrument Name: [id number] ?Procedure:             Upper GI endoscopy ?Indications:           Dysphagia ?Providers:             Lucilla Lame MD, MD ?Referring MD:          Delsa Grana (Referring MD) ?Medicines:             Monitored Anesthesia Care, Propofol per Anesthesia ?Complications:         No immediate complications. ?Procedure:             Pre-Anesthesia Assessment: ?                       - Prior to the procedure, a History and Physical was  ?                       performed, and patient medications and allergies were  ?                       reviewed. The patient's tolerance of previous  ?                       anesthesia was also reviewed. The risks and benefits  ?                       of the procedure and the sedation options and risks  ?                       were discussed with the patient. All questions were  ?                       answered, and informed consent was obtained. Prior  ?                       Anticoagulants: The patient has taken no previous  ?                       anticoagulant or antiplatelet agents. ASA Grade  ?                       Assessment: II - A patient with mild systemic disease.  ?                       After reviewing the risks and benefits, the patient  ?                       was deemed in satisfactory condition to undergo the  ?                       procedure. ?                       After obtaining informed consent, the endoscope was  ?  passed under direct vision. Throughout the procedure,  ?                       the patient's blood pressure, pulse, and oxygen  ?                       saturations were monitored continuously. The Endoscope  ?                       was  introduced through the mouth, and advanced to the  ?                       second part of duodenum. The upper GI endoscopy was  ?                       accomplished without difficulty. The patient tolerated  ?                       the procedure well. ?Findings: ?     The examined esophagus was normal. A TTS dilator was passed through the  ?     scope. Dilation with a 15-16.5-18 mm balloon dilator was performed to 18  ?     mm. The dilation site was examined following endoscope reinsertion and  ?     showed complete resolution of luminal narrowing. ?     The stomach was normal. ?     The examined duodenum was normal. ?Impression:            - Normal esophagus. Dilated. ?                       - Normal stomach. ?                       - Normal examined duodenum. ?                       - No specimens collected. ?Recommendation:        - Discharge patient to home. ?                       - Resume previous diet. ?                       - Continue present medications. ?Procedure Code(s):     --- Professional --- ?                       [phone], Esophagogastroduodenoscopy, flexible,  ?                       transoral; with transendoscopic balloon dilation of  ?                       esophagus (less than 30 mm diameter) ?Diagnosis Code(s):     --- Professional --- ?                       R13.10, Dysphagia, unspecified ?CPT copyright 2019 American Medical Association. All rights reserved. ?The codes documented in this report are preliminary and upon coder review may  ?be revised to meet current compliance requirements. ?Breonna Gafford Allen Norris  MD, MD ?08/15/2021 10:39:42 AM ?This report has been signed electronically. ?Number of Addenda: 0 ?Note Initiated On: 08/15/2021 10:08 AM ?Total Procedure Duration: 0 hours 4 minutes 27 seconds  ?Estimated Blood Loss:  Estimated blood loss: none. ?     [facility] ?

## 2021-08-18 ENCOUNTER — Encounter: Payer: Self-pay | Admitting: Gastroenterology

## 2021-08-18 LAB — SURGICAL PATHOLOGY

## 2021-08-19 ENCOUNTER — Encounter: Payer: Self-pay | Admitting: Gastroenterology

## 2021-08-29 ENCOUNTER — Other Ambulatory Visit: Payer: Self-pay | Admitting: Family Medicine

## 2021-08-29 DIAGNOSIS — E782 Mixed hyperlipidemia: Secondary | ICD-10-CM

## 2021-08-29 DIAGNOSIS — I10 Essential (primary) hypertension: Secondary | ICD-10-CM

## 2021-09-01 NOTE — Telephone Encounter (Signed)
Requested medications are due for refill today.  yes ? ?Requested medications are on the active medications list.  yes ? ?Last refill. 08/26/2020 #90 3 refills for both ? ?Future visit scheduled.   yes ? ?Notes to clinic.  Refill for lisinopril failed protocol d/t abnormal labs. Rx's signed by Dr. Ky Barban.  ? ? ? ?Requested Prescriptions  ?Pending Prescriptions Disp Refills  ? lisinopril (ZESTRIL) 20 MG tablet [Pharmacy Med Name: LISINOPRIL '20MG'$  TABLETS] 90 tablet 3  ?  Sig: TAKE 1 TABLET(20 MG) BY MOUTH DAILY  ?  ? Cardiovascular:  ACE Inhibitors Failed - 08/29/2021  7:00 AM  ?  ?  Failed - Cr in normal range and within 180 days  ?  Creatinine, Ser  ?Date Value Ref Range Status  ?04/04/2021 1.63 (H) 0.76 - 1.27 mg/dL Final  ?  ?  ?  ?  Failed - Last BP in normal range  ?  BP Readings from Last 1 Encounters:  ?08/15/21 (!) 152/94  ?  ?  ?  ?  Passed - K in normal range and within 180 days  ?  Potassium  ?Date Value Ref Range Status  ?04/04/2021 4.5 3.5 - 5.2 mmol/L Final  ?  ?  ?  ?  Passed - Patient is not pregnant  ?  ?  Passed - Valid encounter within last 6 months  ?  Recent Outpatient Visits   ? ?      ? 5 months ago Essential hypertension, benign  ? [place], DO  ? 11 months ago Mixed hyperlipidemia  ? [facility], [place], [place]  ? 2 years ago Essential hypertension, benign  ? [facility] Conners D, MD  ? 2 years ago Essential hypertension, benign  ? [place], NP  ? 3 years ago Mixed hyperlipidemia  ? [facility] [place], Satira Anis, MD  ? ?  ?  ?Future Appointments   ? ?        ? In 1 month Teodora Medici, DO [facility]  ? ?  ? ?  ?  ?  ? atorvastatin (LIPITOR) 80 MG tablet [Pharmacy Med Name: ATORVASTATIN '80MG'$  TABLETS] 90 tablet 1  ?  Sig: TAKE 1 TABLET(80 MG) BY MOUTH DAILY AT 6 PM  ?  ? Cardiovascular:  Antilipid -  Statins Failed - 08/29/2021  7:00 AM  ?  ?  Failed - Lipid Panel in normal range within the last 12 months  ?  Cholesterol, Total  ?Date Value Ref Range Status  ?09/10/2020 129 100 - 199 mg/dL Final  ? ?LDL Chol Calc (NIH)  ?Date Value Ref Range Status  ?09/10/2020 65 0 - 99 mg/dL Final  ? ?HDL  ?Date Value Ref Range Status  ?09/10/2020 46 >39 mg/dL Final  ? ?Triglycerides  ?Date Value Ref Range Status  ?09/10/2020 92 0 - 149 mg/dL Final  ? ?  ?  ?  Passed - Patient is not pregnant  ?  ?  Passed - Valid encounter within last 12 months  ?  Recent Outpatient Visits   ? ?      ? 5 months ago Essential hypertension, benign  ? [place], DO  ? 11 months ago Mixed hyperlipidemia  ? [facility] [place], [place], [place]  ? 2 years ago Essential hypertension, benign  ? [facility]  [place] Malkin, MD  ? 2 years ago Essential hypertension, benign  ? [place], NP  ? 3 years ago Mixed hyperlipidemia  ? [facility] [place], Satira Anis, MD  ? ?  ?  ?Future Appointments   ? ?        ? In 1 month Teodora Medici, [facility], [place]  ? ?  ? ?  ?  ?  ?  ?

## 2021-10-03 ENCOUNTER — Encounter: Payer: Self-pay | Admitting: Internal Medicine

## 2021-10-03 ENCOUNTER — Ambulatory Visit: Payer: Managed Care, Other (non HMO) | Admitting: Internal Medicine

## 2021-10-03 VITALS — BP 124/82 | HR 68 | Temp 98.1°F | Resp 16 | Ht 73.0 in | Wt 258.8 lb

## 2021-10-03 DIAGNOSIS — J301 Allergic rhinitis due to pollen: Secondary | ICD-10-CM | POA: Diagnosis not present

## 2021-10-03 DIAGNOSIS — E782 Mixed hyperlipidemia: Secondary | ICD-10-CM | POA: Diagnosis not present

## 2021-10-03 DIAGNOSIS — N1831 Chronic kidney disease, stage 3a: Secondary | ICD-10-CM

## 2021-10-03 DIAGNOSIS — I10 Essential (primary) hypertension: Secondary | ICD-10-CM

## 2021-10-03 DIAGNOSIS — B353 Tinea pedis: Secondary | ICD-10-CM

## 2021-10-03 MED ORDER — LEVOCETIRIZINE DIHYDROCHLORIDE 5 MG PO TABS: 5.0000 mg | ORAL_TABLET | Freq: Every evening | ORAL | 0 refills | Status: DC

## 2021-10-03 MED ORDER — KETOCONAZOLE 2 % EX CREA: 1.0000 "application " | TOPICAL_CREAM | Freq: Every day | CUTANEOUS | 0 refills | Status: AC

## 2021-10-03 NOTE — Progress Notes (Signed)
? ?Established Patient Office Visit ? ?Subjective:  ?Patient ID: Chad Hendricks, male    DOB: 07-25-1965  Age: 56 y.o. MRN: [id number] ? ?CC:  ?Chief Complaint  ?Patient presents with  ? Follow-up  ? Hypertension  ? Hyperlipidemia  ? ? ?HPI ?Chad Hendricks presents for follow-up on chronic medical conditions. Complaining  ? ?Hypertension: ?-Medications: Lisinopril 20 mg ?-Patient is compliant with above medications and reports no side effects. ?-Checking BP at home (average): average 130-132/80 ?-Denies any SOB, CP, vision changes, LE edema or symptoms of hypotension ?-Diet: Likes to eat chicken, fish, some beef, usually skips dinner for protein shakes  ?-Exercise: weight lifting  ? ?HLD: ?-Medications: Lipitor 80 mg, Omega 3 fatty acids ?-Patient is compliant with above medications and reports no side effects.  ?-Last lipid panel: Lipid Panel  ?   ?Component Value Date/Time  ? CHOL 129 09/10/2020 1142  ? TRIG 92 09/10/2020 1142  ? HDL 46 09/10/2020 1142  ? CHOLHDL 2.8 09/10/2020 1142  ? [place] 65 09/10/2020 1142  ? LABVLDL 18 09/10/2020 1142  ? ?Hypogonadism/ED: ?-Testosterone injections from Urology, saw them last month )no changes), sees every 6 months  ?-Sildenafil as needed ? ?NASH: ?-Worked up by GI ?-RUQ from 07/22/21 showing mild increase hepatic echogenicity consistent with fatty infiltration   ? ?Seasonal Allergic Rhinitis: ?-Seen at Urgent Care for sinus infection and had to be treated with antibiotics ?-Currently Xyzal 5 mg daily, Flonase ?-Having throat clearing, voice hoarse, dry cough ? ?Health maintenance: ?-Colonoscopy: Colonoscopy 3/23, repeat in 7 years - father diatgnosed with colon cancer at 63 years old ?-Blood work due ? ?Past Medical History:  ?Diagnosis Date  ? Allergy   ? Hyperlipidemia   ? Hypertension   ? Seasonal allergies   ? Wears contact lenses   ? ? ?Past Surgical History:  ?Procedure Laterality Date  ? BALLOON DILATION N/A 08/15/2021  ? Procedure: BALLOON DILATION;  Surgeon:  Lucilla Lame, MD;  Location: [place];  Service: Endoscopy;  Laterality: N/A;  ? COLONOSCOPY    ? COLONOSCOPY WITH PROPOFOL N/A 07/20/2016  ? Procedure: COLONOSCOPY WITH PROPOFOL;  Surgeon: Lucilla Lame, MD;  Location: [place];  Service: Endoscopy;  Laterality: N/A;  ? COLONOSCOPY WITH PROPOFOL N/A 08/15/2021  ? Procedure: COLONOSCOPY WITH PROPOFOL;  Surgeon: Lucilla Lame, MD;  Location: [place];  Service: Endoscopy;  Laterality: N/A;  ? ESOPHAGOGASTRODUODENOSCOPY (EGD) WITH PROPOFOL N/A 08/15/2021  ? Procedure: ESOPHAGOGASTRODUODENOSCOPY (EGD) WITH PROPOFOL;  Surgeon: Lucilla Lame, MD;  Location: [place];  Service: Endoscopy;  Laterality: N/A;  ? POLYPECTOMY N/A 08/15/2021  ? Procedure: POLYPECTOMY;  Surgeon: Lucilla Lame, MD;  Location: [place];  Service: Endoscopy;  Laterality: N/A;  ? VASECTOMY    ? ? ?Family History  ?Problem Relation Age of Onset  ? Cancer Mother   ? Kidney disease Mother   ? Kidney disease Father   ? Prostate cancer Brother   ? Prostate cancer Brother   ? Kidney disease Brother   ? Diabetes Brother   ? Diabetes Brother   ? Diabetes Brother   ? Hypertension Maternal Grandmother   ? Hypertension Maternal Grandfather   ? Hypertension Paternal Grandmother   ? Hypertension Paternal Grandfather   ? ? ?Social History  ? ?Socioeconomic History  ? Marital status: Married  ?  Spouse name: Not on file  ? Number of children: Not on file  ? Years of education: Not on file  ? Highest education level: Not  on file  ?Occupational History  ? Not on file  ?Tobacco Use  ? Smoking status: Never  ? Smokeless tobacco: Never  ?Vaping Use  ? Vaping Use: Never used  ?Substance and Sexual Activity  ? Alcohol use: Yes  ?  Alcohol/week: 1.0 standard drink  ?  Types: 1 Glasses of wine per week  ? Drug use: No  ? Sexual activity: Yes  ?Other Topics Concern  ? Not on file  ?Social History Narrative  ? Not on file  ? ?Social Determinants of Health  ? ?Financial Resource  Strain: Not on file  ?Food Insecurity: Not on file  ?Transportation Needs: Not on file  ?Physical Activity: Not on file  ?Stress: Not on file  ?Social Connections: Not on file  ?Intimate Partner Violence: Not on file  ? ? ?Outpatient Medications Prior to Visit  ?Medication Sig Dispense Refill  ? atorvastatin (LIPITOR) 80 MG tablet TAKE 1 TABLET(80 MG) BY MOUTH DAILY AT 6 PM 90 tablet 1  ? cetirizine (ZYRTEC) 10 MG tablet Take by mouth.    ? Cholecalciferol (VITAMIN D3) 5000 units CAPS Take 1 capsule by mouth daily.    ? co-enzyme Q-10 50 MG capsule Take 50 mg by mouth daily.    ? DHA-EPA-VITAMIN E PO Take 1 tablet by mouth daily.  (Patient not taking: Reported on 08/07/2021)    ? lisinopril (ZESTRIL) 20 MG tablet TAKE 1 TABLET(20 MG) BY MOUTH DAILY 90 tablet 3  ? Multiple Vitamins-Minerals (MULTIVITAMIN ADULT PO) Take 1 capsule by mouth daily.    ? Omega-3 Fatty Acids (OMEGA 3 500) 500 MG CAPS Take 500 mg by mouth daily.    ? sildenafil (REVATIO) 20 MG tablet SMARTSIG:1-5 Tablet(s) By Mouth Daily PRN    ? testosterone cypionate (DEPOTESTOSTERONE CYPIONATE) 200 MG/ML injection Inject 1 mL into the muscle every 14 (fourteen) days.     ? ?No facility-administered medications prior to visit.  ? ? ?Allergies  ?Allergen Reactions  ? Latex Rash  ?  Only if wears gloves for extended period  ? ? ?ROS ?Review of Systems  ?Constitutional:  Negative for chills and fever.  ?Eyes:  Negative for visual disturbance.  ?Respiratory:  Negative for cough and shortness of breath.   ?Cardiovascular:  Negative for chest pain and palpitations.  ?Gastrointestinal:  Negative for abdominal pain, nausea and vomiting.  ?Neurological:  Negative for dizziness and weakness.  ? ?  ?Objective:  ?  ?Physical Exam ?Constitutional:   ?   Appearance: Normal appearance.  ?HENT:  ?   Head: Normocephalic and atraumatic.  ?   Mouth/Throat:  ?   Mouth: Mucous membranes are moist.  ?   Comments: PND present  ?Eyes:  ?   Conjunctiva/sclera: Conjunctivae normal.   ?Cardiovascular:  ?   Rate and Rhythm: Normal rate and regular rhythm.  ?Pulmonary:  ?   Effort: Pulmonary effort is normal.  ?   Breath sounds: Normal breath sounds.  ?Abdominal:  ?   General: There is no distension.  ?   Palpations: Abdomen is soft.  ?   Tenderness: There is no abdominal tenderness. There is no guarding.  ?Musculoskeletal:  ?   Right lower leg: No edema.  ?   Left lower leg: No edema.  ?Skin: ?   General: Skin is warm and dry.  ?Neurological:  ?   General: No focal deficit present.  ?   Mental Status: He is alert. Mental status is at baseline.  ?Psychiatric:     ?  Mood and Affect: Mood normal.     ?   Behavior: Behavior normal.  ? ? ?BP 124/82   Pulse 68   Temp 98.1 ?F (36.7 ?C)   Resp 16   Ht 6' 1" (1.854 m)   Wt 258 lb 12.8 oz (117.4 kg)   HC 16" (40.6 cm)   SpO2 99%   BMI 34.14 kg/m?  ?Wt Readings from Last 3 Encounters:  ?08/15/21 253 lb 14.4 oz (115.2 kg)  ?07/17/21 258 lb 9.6 oz (117.3 kg)  ?04/04/21 252 lb 14.4 oz (114.7 kg)  ? ? ? ?Health Maintenance Due  ?Topic Date Due  ? COVID-19 Vaccine (3 - Booster for Pfizer series) 11/22/2019  ? ? ?There are no preventive care reminders to display for this patient. ? ?Lab Results  ?Component Value Date  ? TSH 0.918 09/10/2020  ? ?Lab Results  ?Component Value Date  ? WBC 4.6 04/04/2021  ? HGB 17.1 04/04/2021  ? HCT 51.2 (H) 04/04/2021  ? MCV 85 04/04/2021  ? PLT 221 04/04/2021  ? ?Lab Results  ?Component Value Date  ? NA 138 04/04/2021  ? K 4.5 04/04/2021  ? CO2 25 04/04/2021  ? GLUCOSE 78 04/04/2021  ? BUN 19 04/04/2021  ? CREATININE 1.63 (H) 04/04/2021  ? BILITOT 0.4 07/17/2021  ? ALKPHOS 72 07/17/2021  ? AST 34 07/17/2021  ? ALT 50 (H) 07/17/2021  ? PROT 7.3 07/17/2021  ? ALBUMIN 4.2 07/17/2021  ? CALCIUM 9.8 04/04/2021  ? EGFR 49 (L) 04/04/2021  ? ?Lab Results  ?Component Value Date  ? CHOL 129 09/10/2020  ? ?Lab Results  ?Component Value Date  ? HDL 46 09/10/2020  ? ?Lab Results  ?Component Value Date  ? [place] 65 09/10/2020  ? ?Lab  Results  ?Component Value Date  ? TRIG 92 09/10/2020  ? ?Lab Results  ?Component Value Date  ? CHOLHDL 2.8 09/10/2020  ? ?No results found for: HGBA1C ? ?  ?Assessment & Plan:  ? ?Problem List Items Addressed T

## 2021-10-03 NOTE — Assessment & Plan Note (Signed)
Stable, continue Lipitor 80 mg and Omega 3 fatty acids. Recheck lipid panel.  ?

## 2021-10-03 NOTE — Assessment & Plan Note (Signed)
Stable, continue Lisinopril 20 mg daily.  ?

## 2021-10-03 NOTE — Assessment & Plan Note (Signed)
Recheck kidney function today.  

## 2021-10-03 NOTE — Patient Instructions (Addendum)
It was great seeing you today! ? ?Plan discussed at today's visit: ?-Blood work ordered today, results will be uploaded to [facility].  ?-For allergies, try new combo nasal spray to help with nasal drainage and allergy symptoms, continue Xzyal which was refilled ?-Fungal cream sent to pharmacy for athlete's foot ?-No other changes made to medications today  ? ?Follow up in: 6 months  ? ?Take care and let us know if you have any questions or concerns prior to your next visit. ? ?Dr. Rosana Berger ? ?

## 2021-10-08 LAB — LIPID PANEL
Chol/HDL Ratio: 3 ratio (ref 0.0–5.0)
Cholesterol, Total: 158 mg/dL (ref 100–199)
HDL: 52 mg/dL (ref >39)
LDL Chol Calc (NIH): 94 mg/dL (ref 0–99)
Triglycerides: 62 mg/dL (ref 0–149)
VLDL Cholesterol Cal: 12 mg/dL (ref 5–40)

## 2021-10-08 LAB — BASIC METABOLIC PANEL
BUN/Creatinine Ratio: 12 (ref 9–20)
BUN: 18 mg/dL (ref 6–24)
CO2: 29 mmol/L (ref 20–29)
Calcium: 9.8 mg/dL (ref 8.7–10.2)
Chloride: 100 mmol/L (ref 96–106)
Creatinine, Ser: 1.51 mg/dL — ABNORMAL HIGH (ref 0.76–1.27)
Glucose: 76 mg/dL (ref 70–99)
Potassium: 4.8 mmol/L (ref 3.5–5.2)
Sodium: 138 mmol/L (ref 134–144)
eGFR: 54 mL/min/{1.73_m2} — ABNORMAL LOW (ref >59)

## 2021-10-16 ENCOUNTER — Encounter: Payer: Self-pay | Admitting: Internal Medicine

## 2021-10-16 DIAGNOSIS — J301 Allergic rhinitis due to pollen: Secondary | ICD-10-CM

## 2021-10-16 MED ORDER — RYALTRIS 665-25 MCG/ACT NA SUSP: 2.0000 | Freq: Two times a day (BID) | NASAL | 1 refills | Status: DC

## 2022-02-24 ENCOUNTER — Other Ambulatory Visit: Payer: Self-pay | Admitting: Internal Medicine

## 2022-02-24 DIAGNOSIS — J301 Allergic rhinitis due to pollen: Secondary | ICD-10-CM

## 2022-02-24 NOTE — Telephone Encounter (Signed)
Requested medications are due for refill today. unsure  Requested medications are on the active medications list.  yes  Last refill. 10/16/2021 29g 1 refill  Future visit scheduled.   yes  Notes to clinic.  Medication not delegated.    Requested Prescriptions  Pending Prescriptions Disp Refills   RYALTRIS 665-25 MCG/ACT SUSP [Pharmacy Med Name: RYALTRIS 665MCG-25MCG NAS SP(240SP)] 29 g 1    Sig: SHAKE LIQUID AND USE 2 SPRAYS IN EACH NOSTRIL TWICE DAILY     Off-Protocol Failed - 02/24/2022  4:13 AM      Failed - Medication not assigned to a protocol, review manually.      Passed - Valid encounter within last 12 months    Recent Outpatient Visits           4 months ago Essential hypertension, benign   [facility] Teodora Medici, DO   10 months ago Essential hypertension, benign   [facility] Teodora Medici, DO   1 year ago Mixed hyperlipidemia   [place], [place]   2 years ago Essential hypertension, benign   [place], MD   3 years ago Essential hypertension, benign   [place], [place], NP       Future Appointments             In 1 month Teodora Medici, [facility], [facility]

## 2022-03-08 ENCOUNTER — Other Ambulatory Visit: Payer: Self-pay | Admitting: Internal Medicine

## 2022-03-08 DIAGNOSIS — E782 Mixed hyperlipidemia: Secondary | ICD-10-CM

## 2022-03-09 NOTE — Telephone Encounter (Signed)
Requested Prescriptions  Pending Prescriptions Disp Refills  . atorvastatin (LIPITOR) 80 MG tablet [Pharmacy Med Name: ATORVASTATIN '80MG'$  TABLETS] 90 tablet 2    Sig: TAKE 1 TABLET(80 MG) BY MOUTH DAILY AT 6 PM     Cardiovascular:  Antilipid - Statins Failed - 03/08/2022  7:03 AM      Failed - Lipid Panel in normal range within the last 12 months    Cholesterol, Total  Date Value Ref Range Status  10/07/2021 158 100 - 199 mg/dL Final   LDL Chol Calc (NIH)  Date Value Ref Range Status  10/07/2021 94 0 - 99 mg/dL Final   HDL  Date Value Ref Range Status  10/07/2021 52 >39 mg/dL Final   Triglycerides  Date Value Ref Range Status  10/07/2021 62 0 - 149 mg/dL Final         Passed - Patient is not pregnant      Passed - Valid encounter within last 12 months    Recent Outpatient Visits          5 months ago Essential hypertension, benign   [place], DO   11 months ago Essential hypertension, benign   [facility] Teodora Medici, DO   1 year ago Mixed hyperlipidemia   [place], [place]   2 years ago Essential hypertension, benign   [place], MD   3 years ago Essential hypertension, benign   [place], [place], NP      Future Appointments            In 4 weeks Teodora Medici, [facility], [facility]

## 2022-04-05 NOTE — Progress Notes (Unsigned)
Established Patient Office Visit  Subjective:  Patient ID: Chad Hendricks, male    DOB: 05-16-1966  Age: 56 y.o. MRN: [id number]  CC:  No chief complaint on file.   HPI Chad Hendricks presents for follow-up on chronic medical conditions.   Hypertension: -Medications: Lisinopril 20 mg -Patient is compliant with above medications and reports no side effects. -Checking BP at home (average): average 130-132/80 -Denies any SOB, CP, vision changes, LE edema or symptoms of hypotension -Diet: Likes to eat chicken, fish, some beef, usually skips dinner for protein shakes  -Exercise: weight lifting   HLD: -Medications: Lipitor 80 mg, Omega 3 fatty acids -Patient is compliant with above medications and reports no side effects.  -Last lipid panel: Lipid Panel     Component Value Date/Time   CHOL 158 10/07/2021 0807   TRIG 62 10/07/2021 0807   HDL 52 10/07/2021 0807   CHOLHDL 3.0 10/07/2021 0807   LDLCALC 94 10/07/2021 0807   LABVLDL 12 10/07/2021 8466   Hypogonadism/ED: -Testosterone injections from Urology, saw them last month )no changes), sees every 6 months  -Sildenafil as needed  NASH: -Worked up by GI -RUQ from 07/22/21 showing mild increase hepatic echogenicity consistent with fatty infiltration    Seasonal Allergic Rhinitis: -Seen at Urgent Care for sinus infection and had to be treated with antibiotics -Currently Xyzal 5 mg daily, Flonase -Having throat clearing, voice hoarse, dry cough  Health maintenance: -Colonoscopy: Colonoscopy 3/23, repeat in 7 years - father diagnosed with colon cancer at 40 years old -Blood work UTD -Flu vaccine, Tdap due  Past Medical History:  Diagnosis Date   Allergy    Hyperlipidemia    Hypertension    Seasonal allergies    Wears contact lenses     Past Surgical History:  Procedure Laterality Date   BALLOON DILATION N/A 08/15/2021   Procedure: BALLOON DILATION;  Surgeon: Lucilla Lame, MD;  Location: [place];   Service: Endoscopy;  Laterality: N/A;   COLONOSCOPY     COLONOSCOPY WITH PROPOFOL N/A 07/20/2016   Procedure: COLONOSCOPY WITH PROPOFOL;  Surgeon: Lucilla Lame, MD;  Location: [place];  Service: Endoscopy;  Laterality: N/A;   COLONOSCOPY WITH PROPOFOL N/A 08/15/2021   Procedure: COLONOSCOPY WITH PROPOFOL;  Surgeon: Lucilla Lame, MD;  Location: [place];  Service: Endoscopy;  Laterality: N/A;   ESOPHAGOGASTRODUODENOSCOPY (EGD) WITH PROPOFOL N/A 08/15/2021   Procedure: ESOPHAGOGASTRODUODENOSCOPY (EGD) WITH PROPOFOL;  Surgeon: Lucilla Lame, MD;  Location: [place];  Service: Endoscopy;  Laterality: N/A;   POLYPECTOMY N/A 08/15/2021   Procedure: POLYPECTOMY;  Surgeon: Lucilla Lame, MD;  Location: [place];  Service: Endoscopy;  Laterality: N/A;   VASECTOMY      Family History  Problem Relation Age of Onset   Cancer Mother    Kidney disease Mother    Kidney disease Father    Prostate cancer Brother    Prostate cancer Brother    Kidney disease Brother    Diabetes Brother    Diabetes Brother    Diabetes Brother    Hypertension Maternal Grandmother    Hypertension Maternal Grandfather    Hypertension Paternal Grandmother    Hypertension Paternal Grandfather     Social History   Socioeconomic History   Marital status: Married    Spouse name: Not on file   Number of children: Not on file   Years of education: Not on file   Highest education level: Not on file  Occupational History   Not  on file  Tobacco Use   Smoking status: Never   Smokeless tobacco: Never  Vaping Use   Vaping Use: Never used  Substance and Sexual Activity   Alcohol use: Yes    Alcohol/week: 1.0 standard drink of alcohol    Types: 1 Glasses of wine per week   Drug use: No   Sexual activity: Yes  Other Topics Concern   Not on file  Social History Narrative   Not on file   Social Determinants of Health   Financial Resource Strain: Not on file  Food Insecurity:  Not on file  Transportation Needs: Not on file  Physical Activity: Not on file  Stress: Not on file  Social Connections: Not on file  Intimate Partner Violence: Not on file    Outpatient Medications Prior to Visit  Medication Sig Dispense Refill   atorvastatin (LIPITOR) 80 MG tablet TAKE 1 TABLET(80 MG) BY MOUTH DAILY AT 6 PM 90 tablet 2   Cholecalciferol (VITAMIN D3) 5000 units CAPS Take 1 capsule by mouth daily.     co-enzyme Q-10 50 MG capsule Take 50 mg by mouth daily.     ketoconazole (NIZORAL) 2 % cream Apply 1 application. topically daily. 15 g 0   levocetirizine (XYZAL ALLERGY 24HR) 5 MG tablet Take 1 tablet (5 mg total) by mouth every evening. 90 tablet 0   lisinopril (ZESTRIL) 20 MG tablet TAKE 1 TABLET(20 MG) BY MOUTH DAILY 90 tablet 3   Multiple Vitamins-Minerals (MULTIVITAMIN ADULT PO) Take 1 capsule by mouth daily.     Omega-3 Fatty Acids (OMEGA 3 500) 500 MG CAPS Take 500 mg by mouth daily.     RYALTRIS 665-25 MCG/ACT SUSP SHAKE LIQUID AND USE 2 SPRAYS IN EACH NOSTRIL TWICE DAILY 29 g 1   sildenafil (REVATIO) 20 MG tablet SMARTSIG:1-5 Tablet(s) By Mouth Daily PRN     testosterone cypionate (DEPOTESTOSTERONE CYPIONATE) 200 MG/ML injection Inject 1 mL into the muscle every 14 (fourteen) days.      No facility-administered medications prior to visit.    Allergies  Allergen Reactions   Latex Rash    Only if wears gloves for extended period    ROS Review of Systems  Constitutional:  Negative for chills and fever.  Eyes:  Negative for visual disturbance.  Respiratory:  Negative for cough and shortness of breath.   Cardiovascular:  Negative for chest pain and palpitations.  Gastrointestinal:  Negative for abdominal pain, nausea and vomiting.  Neurological:  Negative for dizziness and weakness.      Objective:    Physical Exam Constitutional:      Appearance: Normal appearance.  HENT:     Head: Normocephalic and atraumatic.     Mouth/Throat:     Mouth: Mucous  membranes are moist.     Comments: PND present  Eyes:     Conjunctiva/sclera: Conjunctivae normal.  Cardiovascular:     Rate and Rhythm: Normal rate and regular rhythm.  Pulmonary:     Effort: Pulmonary effort is normal.     Breath sounds: Normal breath sounds.  Abdominal:     General: There is no distension.     Palpations: Abdomen is soft.     Tenderness: There is no abdominal tenderness. There is no guarding.  Musculoskeletal:     Right lower leg: No edema.     Left lower leg: No edema.  Skin:    General: Skin is warm and dry.  Neurological:     General: No focal deficit present.  Mental Status: He is alert. Mental status is at baseline.  Psychiatric:        Mood and Affect: Mood normal.        Behavior: Behavior normal.     There were no vitals taken for this visit. Wt Readings from Last 3 Encounters:  10/03/21 258 lb 12.8 oz (117.4 kg)  08/15/21 253 lb 14.4 oz (115.2 kg)  07/17/21 258 lb 9.6 oz (117.3 kg)     Health Maintenance Due  Topic Date Due   COVID-19 Vaccine (3 - Pfizer series) 11/22/2019   INFLUENZA VACCINE  01/06/2022   TETANUS/TDAP  01/20/2022    There are no preventive care reminders to display for this patient.  Lab Results  Component Value Date   TSH 0.918 09/10/2020   Lab Results  Component Value Date   WBC 4.6 04/04/2021   HGB 17.1 04/04/2021   HCT 51.2 (H) 04/04/2021   MCV 85 04/04/2021   PLT 221 04/04/2021   Lab Results  Component Value Date   NA 138 10/07/2021   K 4.8 10/07/2021   CO2 29 10/07/2021   GLUCOSE 76 10/07/2021   BUN 18 10/07/2021   CREATININE 1.51 (H) 10/07/2021   BILITOT 0.4 07/17/2021   ALKPHOS 72 07/17/2021   AST 34 07/17/2021   ALT 50 (H) 07/17/2021   PROT 7.3 07/17/2021   ALBUMIN 4.2 07/17/2021   CALCIUM 9.8 10/07/2021   EGFR 54 (L) 10/07/2021   Lab Results  Component Value Date   CHOL 158 10/07/2021   Lab Results  Component Value Date   HDL 52 10/07/2021   Lab Results  Component Value Date    LDLCALC 94 10/07/2021   Lab Results  Component Value Date   TRIG 62 10/07/2021   Lab Results  Component Value Date   CHOLHDL 3.0 10/07/2021   No results found for: "HGBA1C"    Assessment & Plan:   Problem List Items Addressed This Visit       Cardiovascular and Mediastinum   Essential hypertension, benign - Primary (Chronic)    Stable, continue Lisinopril 20 mg daily.        Relevant Orders   Basic Metabolic Panel (BMET)     Genitourinary   Stage 3a chronic kidney disease ([place])    Recheck kidney function today.        Relevant Orders   Basic Metabolic Panel (BMET)     Other   Hyperlipidemia (Chronic)    Stable, continue Lipitor 80 mg and Omega 3 fatty acids. Recheck lipid panel.        Relevant Orders   Lipid Profile   Other Visit Diagnoses     Seasonal allergic rhinitis due to pollen    - Continue oral anti-histamines, refill Xyzal 5 mg daily. Given a sample of dual nasal spray Ryaltris, 2 sprays in each nostril twice daily to try for symptoms.   Relevant Medications   levocetirizine (XYZAL ALLERGY 24HR) 5 MG tablet   Tinea pedis of right foot    - Treated with ketoconazole, apply daily.    Relevant Medications   ketoconazole (NIZORAL) 2 % cream      Follow-up: No follow-ups on file.    Teodora Medici, DO

## 2022-04-06 ENCOUNTER — Ambulatory Visit: Payer: Managed Care, Other (non HMO) | Admitting: Internal Medicine

## 2022-04-06 ENCOUNTER — Encounter: Payer: Self-pay | Admitting: Internal Medicine

## 2022-04-06 VITALS — BP 132/84 | HR 78 | Temp 98.2°F | Resp 16 | Ht 73.0 in | Wt 262.6 lb

## 2022-04-06 DIAGNOSIS — E782 Mixed hyperlipidemia: Secondary | ICD-10-CM | POA: Diagnosis not present

## 2022-04-06 DIAGNOSIS — L309 Dermatitis, unspecified: Secondary | ICD-10-CM | POA: Diagnosis not present

## 2022-04-06 DIAGNOSIS — J301 Allergic rhinitis due to pollen: Secondary | ICD-10-CM | POA: Diagnosis not present

## 2022-04-06 DIAGNOSIS — I10 Essential (primary) hypertension: Secondary | ICD-10-CM

## 2022-04-06 MED ORDER — TRIAMCINOLONE ACETONIDE 0.1 % EX CREA: 1.0000 | TOPICAL_CREAM | Freq: Two times a day (BID) | CUTANEOUS | 0 refills | Status: AC

## 2022-04-06 MED ORDER — LEVOCETIRIZINE DIHYDROCHLORIDE 5 MG PO TABS: 5.0000 mg | ORAL_TABLET | Freq: Every evening | ORAL | 1 refills | Status: DC

## 2022-04-06 NOTE — Patient Instructions (Signed)
It was great seeing you today!  Plan discussed at today's visit: -Medications refilled -Plan for fasting labs at follow up, at least 8-12 hours   Follow up in: 6 months   Take care and let us know if you have any questions or concerns prior to your next visit.  Dr. Rosana Berger

## 2022-06-07 IMAGING — US US ABDOMEN LIMITED
1 series · 14 of 25 positions shown · non-contrast
Comparison: Splenic ultrasound 07/02/2015.

CLINICAL DATA: Liver lesion.

EXAM:
ULTRASOUND ABDOMEN LIMITED RIGHT UPPER QUADRANT

[Series 1: us abdomen limited · 0.20mm/px · 14 of 29 slices shown]
[im 1/29]
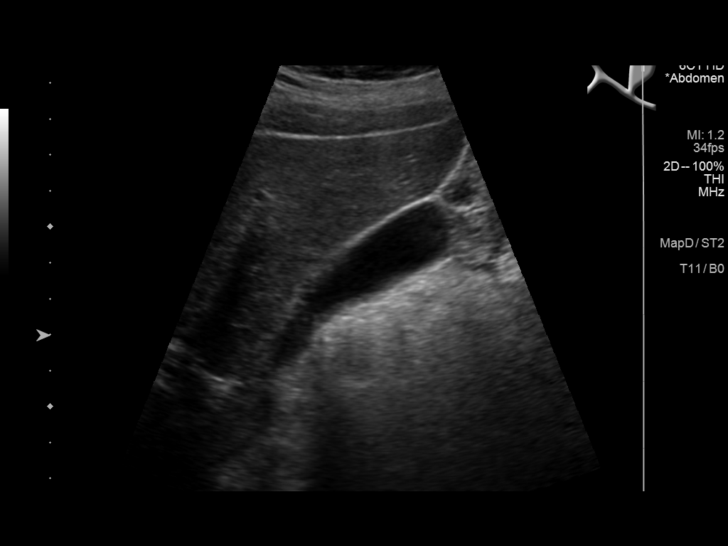
[im 3/29]
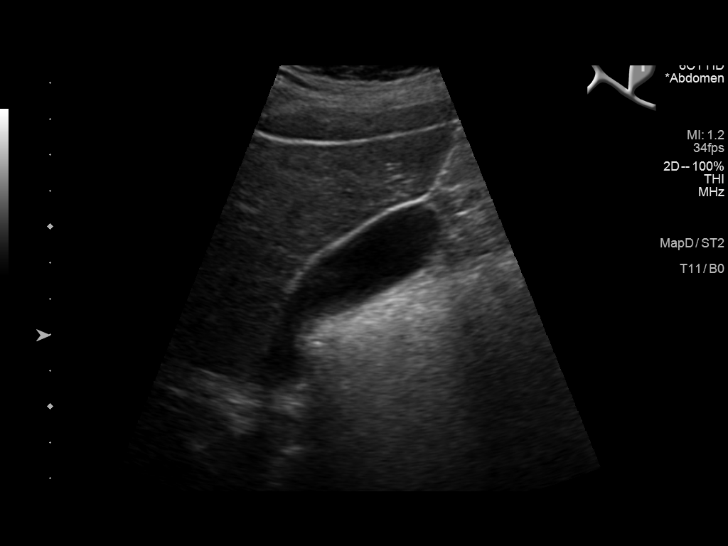
[im 5/29]
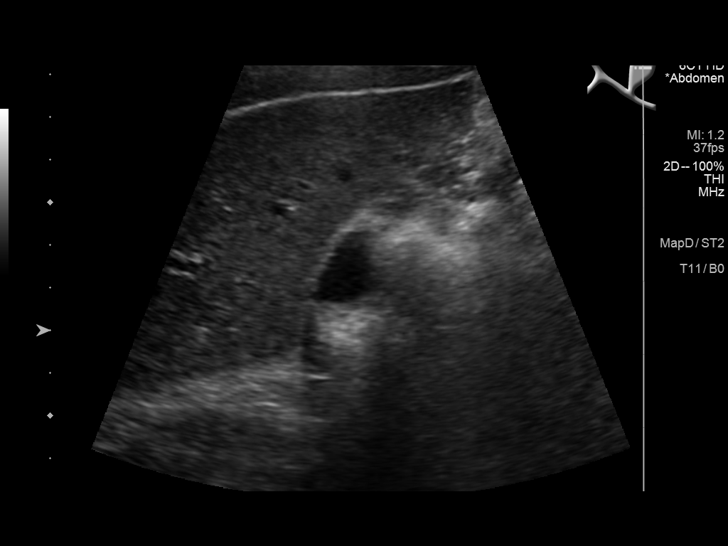
[im 8/29]
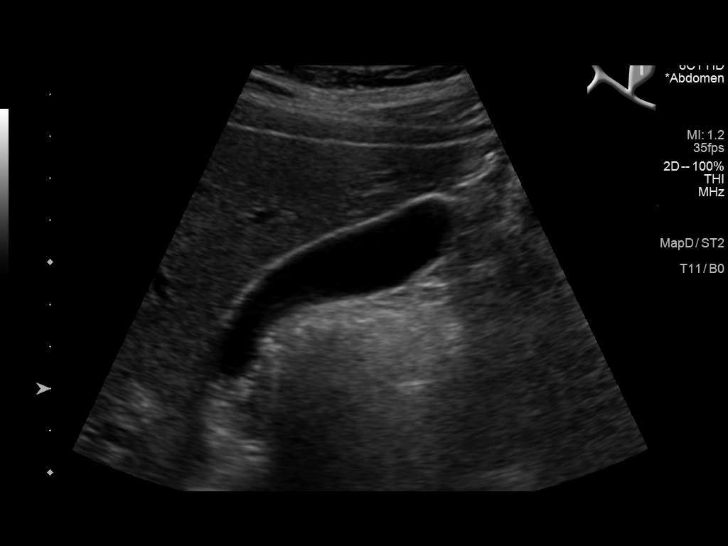
[im 10/29]
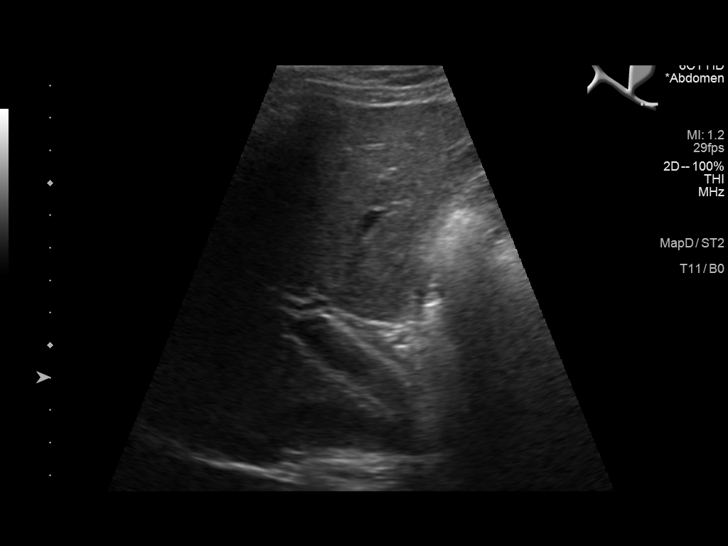
[im 11/29]
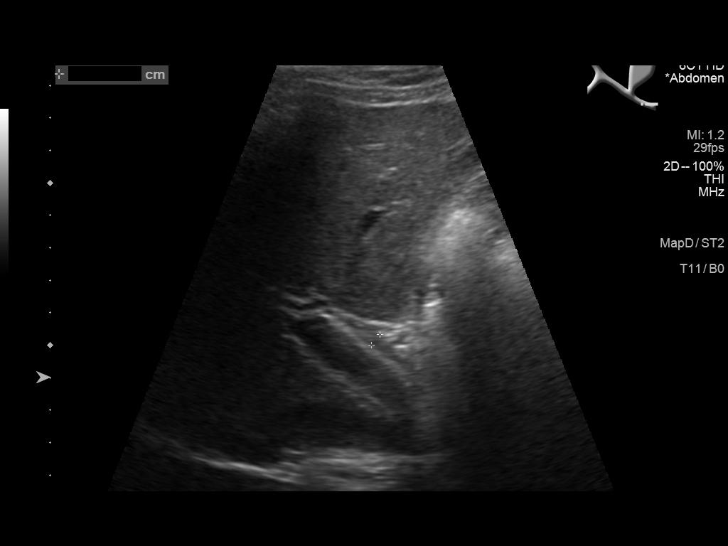
[im 13/29]
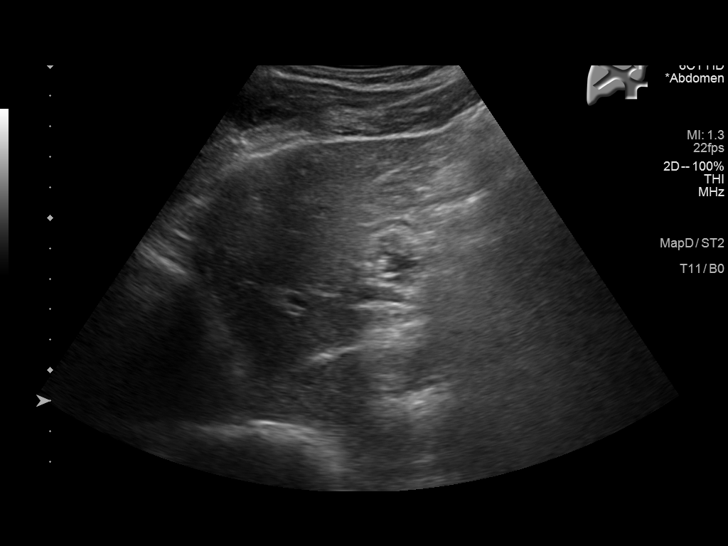
[im 16/29]
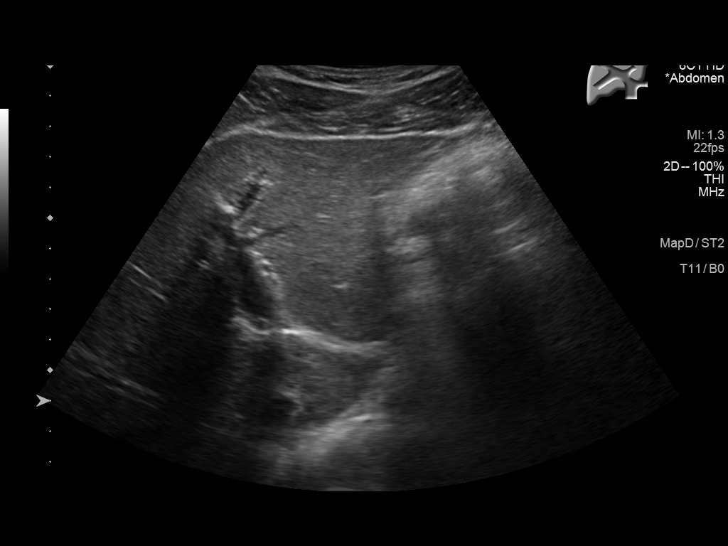
[im 18/29]
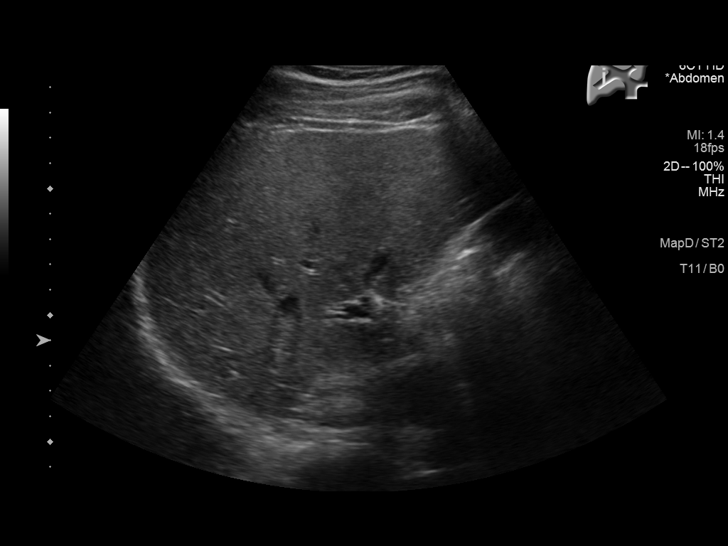
[im 19/29]
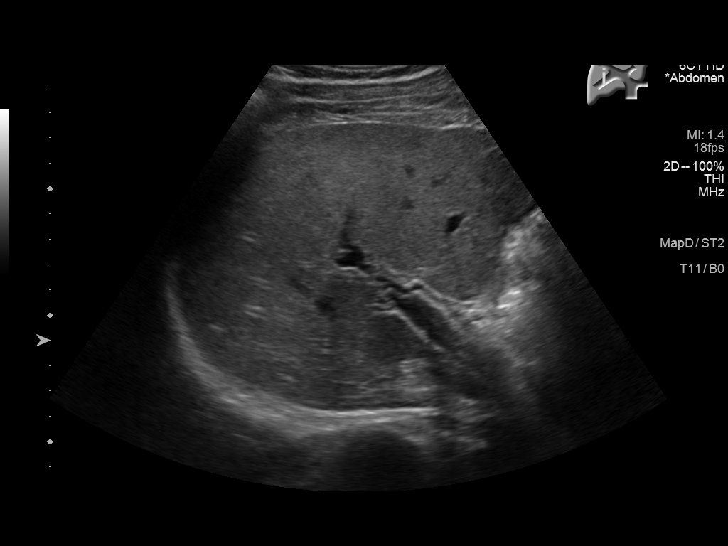
[im 22/29]
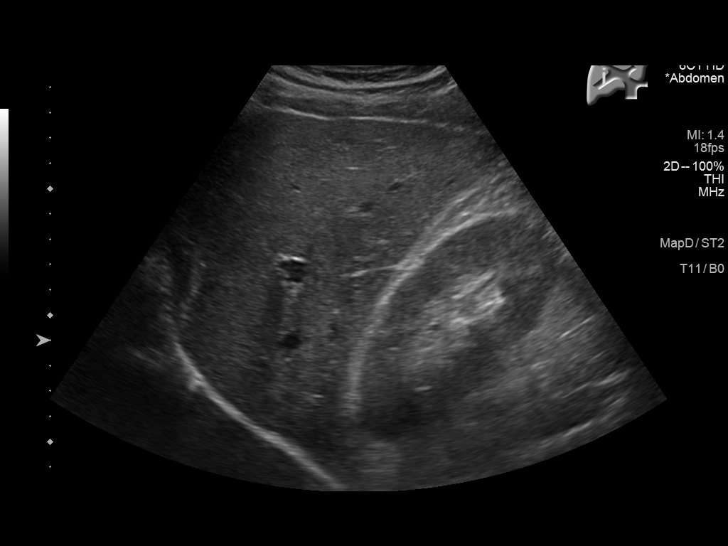
[im 24/29]
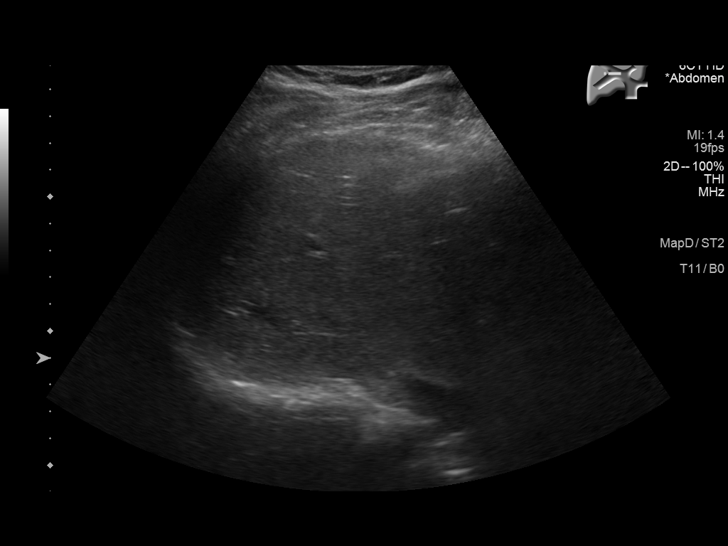
[im 26/29]
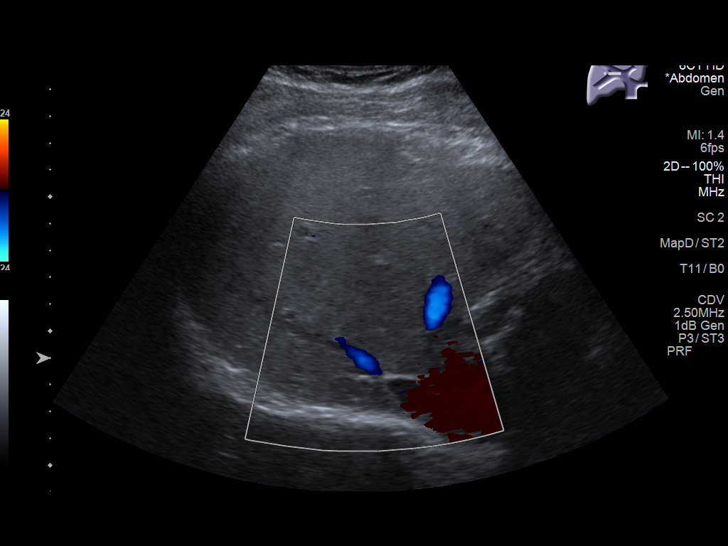
[im 29/29]
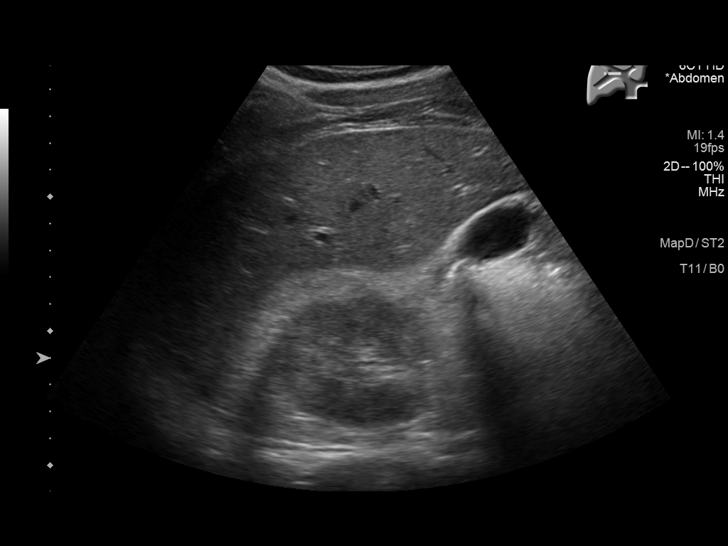

[14 of 25 positions shown; findings below may reference images not displayed]

FINDINGS: Gallbladder:

No gallstones or wall thickening visualized. No sonographic Murphy
sign noted by sonographer.

Common bile duct:

Diameter: 4.2 mm

Liver:

Mild increased echogenicity consistent with fatty infiltration and
or hepatocellular disease. No focal hepatic abnormality identified.
Portal vein is patent on color Doppler imaging with normal direction
of blood flow towards the liver.

Other: None.
IMPRESSION: 1. Mild increased hepatic echogenicity consistent with fatty
infiltration and or hepatocellular disease. No focal hepatic
abnormality identified. If focal liver lesion remains suspected MRI
of the abdomen can be obtained to further evaluate.

2.  No gallstones or biliary distention.

## 2022-09-04 ENCOUNTER — Other Ambulatory Visit: Payer: Self-pay | Admitting: Internal Medicine

## 2022-09-04 DIAGNOSIS — I10 Essential (primary) hypertension: Secondary | ICD-10-CM

## 2022-09-04 NOTE — Telephone Encounter (Signed)
Requested Prescriptions  Pending Prescriptions Disp Refills   lisinopril (ZESTRIL) 20 MG tablet [Pharmacy Med Name: LISINOPRIL 20MG  TABLETS] 90 tablet 0    Sig: TAKE 1 TABLET(20 MG) BY MOUTH DAILY     Cardiovascular:  ACE Inhibitors Failed - 09/04/2022  6:59 AM      Failed - Cr in normal range and within 180 days    Creatinine, Ser  Date Value Ref Range Status  10/07/2021 1.51 (H) 0.76 - 1.27 mg/dL Final         Failed - K in normal range and within 180 days    Potassium  Date Value Ref Range Status  10/07/2021 4.8 3.5 - 5.2 mmol/L Final         Passed - Patient is not pregnant      Passed - Last BP in normal range    BP Readings from Last 1 Encounters:  04/06/22 132/84         Passed - Valid encounter within last 6 months    Recent Outpatient Visits           5 months ago Seasonal allergic rhinitis due to pollen   [place], DO   11 months ago Essential hypertension, benign   [facility] Teodora Medici, DO   1 year ago Essential hypertension, benign   [facility] Teodora Medici, DO   1 year ago Mixed hyperlipidemia   [facility] Trinna Post, [place]   3 years ago Essential hypertension, benign   [facility] Towanda Malkin, MD       Future Appointments             In 1 month Teodora Medici, [facility], [facility]

## 2022-10-05 ENCOUNTER — Ambulatory Visit: Payer: Managed Care, Other (non HMO) | Admitting: Internal Medicine

## 2022-10-11 NOTE — Progress Notes (Unsigned)
Established Patient Office Visit  Subjective:  Patient ID: Chad Hendricks, male    DOB: [birth year]  Age: 57 y.o. MRN: [id number]  CC:  No chief complaint on file.   HPI Chad Hendricks presents for follow-up on chronic medical conditions.   Hypertension: -Medications: Lisinopril 20 mg -Patient is compliant with above medications and reports no side effects. -Checking BP at home (average): average 130-132/80 -Denies any SOB, CP, vision changes, LE edema or symptoms of hypotension -Diet: Likes to eat chicken, fish, some beef, usually skips dinner for protein shakes  -Exercise: weight lifting -does occasionally get contact dermatitis under his arms and chest where he sweats after working out - was prescribed Lotrimin cream in the past which did clear up symptoms.  HLD: -Medications: Lipitor 80 mg, Omega 3 fatty acids -Patient is compliant with above medications and reports no side effects.  -Last lipid panel: Lipid Panel     Component Value Date/Time   CHOL 158 10/07/2021 0807   TRIG 62 10/07/2021 0807   HDL 52 10/07/2021 0807   CHOLHDL 3.0 10/07/2021 0807   LDLCALC 94 10/07/2021 0807   LABVLDL 12 10/07/2021 4098   Hypogonadism/ED: -Testosterone injections from Urology, saw them last month, had testosterone and PSA checked which per the patient both were normal ( I cannot review these results) -Sildenafil as needed  NASH: -Worked up by GI -RUQ from 07/22/21 showing mild increase hepatic echogenicity consistent with fatty infiltration    Seasonal Allergic Rhinitis: -Doing well, no symptoms currently  -Currently Xyzal 5 mg daily, Ryaltris nasal spray and doing well   Health maintenance: -Colonoscopy: Colonoscopy 3/23, repeat in 7 years - father diagnosed with colon cancer at 17 years old -Blood work due  Past Medical History:  Diagnosis Date   Allergy    Hyperlipidemia    Hypertension    Seasonal allergies    Wears contact lenses     Past Surgical History:   Procedure Laterality Date   BALLOON DILATION N/A 08/15/2021   Procedure: BALLOON DILATION;  Surgeon: Midge Minium, MD;  Location: [facility];  Service: Endoscopy;  Laterality: N/A;   COLONOSCOPY     COLONOSCOPY WITH PROPOFOL N/A 07/20/2016   Procedure: COLONOSCOPY WITH PROPOFOL;  Surgeon: Midge Minium, MD;  Location: [facility];  Service: Endoscopy;  Laterality: N/A;   COLONOSCOPY WITH PROPOFOL N/A 08/15/2021   Procedure: COLONOSCOPY WITH PROPOFOL;  Surgeon: Midge Minium, MD;  Location: [facility];  Service: Endoscopy;  Laterality: N/A;   ESOPHAGOGASTRODUODENOSCOPY (EGD) WITH PROPOFOL N/A 08/15/2021   Procedure: ESOPHAGOGASTRODUODENOSCOPY (EGD) WITH PROPOFOL;  Surgeon: Midge Minium, MD;  Location: [facility];  Service: Endoscopy;  Laterality: N/A;   POLYPECTOMY N/A 08/15/2021   Procedure: POLYPECTOMY;  Surgeon: Midge Minium, MD;  Location: [facility] [place] [facility];  Service: Endoscopy;  Laterality: N/A;   VASECTOMY      Family History  Problem Relation Age of Onset   Cancer Mother    Kidney disease Mother    Kidney disease Father    Prostate cancer Brother    Prostate cancer Brother    Kidney disease Brother    Diabetes Brother    Diabetes Brother    Diabetes Brother    Hypertension Maternal Grandmother    Hypertension Maternal Grandfather    Hypertension Paternal Grandmother    Hypertension Paternal Grandfather     Social History   Socioeconomic History   Marital status: Married    Spouse name: Not on file   Number of  children: Not on file   Years of education: Not on file   Highest education level: Not on file  Occupational History   Not on file  Tobacco Use   Smoking status: Never   Smokeless tobacco: Never  Vaping Use   Vaping Use: Never used  Substance and Sexual Activity   Alcohol use: Yes    Alcohol/week: 1.0 standard drink of alcohol    Types: 1 Glasses of wine per week   Drug use: No   Sexual activity: Yes  Other Topics Concern    Not on file  Social History Narrative   Not on file   Social Determinants of Health   Financial Resource Strain: Not on file  Food Insecurity: Not on file  Transportation Needs: Not on file  Physical Activity: Not on file  Stress: Not on file  Social Connections: Not on file  Intimate Partner Violence: Not on file    Outpatient Medications Prior to Visit  Medication Sig Dispense Refill   atorvastatin (LIPITOR) 80 MG tablet TAKE 1 TABLET(80 MG) BY MOUTH DAILY AT 6 PM 90 tablet 2   Cholecalciferol (VITAMIN D3) 5000 units CAPS Take 1 capsule by mouth daily.     co-enzyme Q-10 50 MG capsule Take 50 mg by mouth daily.     ketoconazole (NIZORAL) 2 % cream Apply 1 application. topically daily. 15 g 0   levocetirizine (XYZAL ALLERGY 24HR) 5 MG tablet Take 1 tablet (5 mg total) by mouth every evening. 90 tablet 1   lisinopril (ZESTRIL) 20 MG tablet TAKE 1 TABLET(20 MG) BY MOUTH DAILY 90 tablet 0   Multiple Vitamins-Minerals (MULTIVITAMIN ADULT PO) Take 1 capsule by mouth daily.     Omega-3 Fatty Acids (OMEGA 3 500) 500 MG CAPS Take 500 mg by mouth daily.     RYALTRIS 665-25 MCG/ACT SUSP SHAKE LIQUID AND USE 2 SPRAYS IN EACH NOSTRIL TWICE DAILY 29 g 1   sildenafil (REVATIO) 20 MG tablet SMARTSIG:1-5 Tablet(s) By Mouth Daily PRN     testosterone cypionate (DEPOTESTOSTERONE CYPIONATE) 200 MG/ML injection Inject 1 mL into the muscle every 14 (fourteen) days.      triamcinolone cream (KENALOG) 0.1 % Apply 1 Application topically 2 (two) times daily. 80 g 0   No facility-administered medications prior to visit.    Allergies  Allergen Reactions   Latex Rash    Only if wears gloves for extended period    ROS Review of Systems  Constitutional:  Negative for chills and fever.  Eyes:  Negative for visual disturbance.  Respiratory:  Positive for cough. Negative for shortness of breath.   Cardiovascular:  Negative for chest pain and palpitations.  Gastrointestinal:  Negative for abdominal  pain.  Skin:  Positive for color change.  Neurological:  Negative for dizziness.      Objective:    Physical Exam Constitutional:      Appearance: Normal appearance.  HENT:     Head: Normocephalic and atraumatic.     Mouth/Throat:     Mouth: Mucous membranes are moist.     Comments: PND present  Eyes:     Conjunctiva/sclera: Conjunctivae normal.  Cardiovascular:     Rate and Rhythm: Normal rate and regular rhythm.  Pulmonary:     Effort: Pulmonary effort is normal.     Breath sounds: Normal breath sounds.  Musculoskeletal:     Right lower leg: No edema.     Left lower leg: No edema.  Skin:    General: Skin is  warm and dry.  Neurological:     General: No focal deficit present.     Mental Status: He is alert. Mental status is at baseline.  Psychiatric:        Mood and Affect: Mood normal.        Behavior: Behavior normal.     There were no vitals taken for this visit. Wt Readings from Last 3 Encounters:  04/06/22 262 lb 9.6 oz (119.1 kg)  10/03/21 258 lb 12.8 oz (117.4 kg)  08/15/21 253 lb 14.4 oz (115.2 kg)     Health Maintenance Due  Topic Date Due   COVID-19 Vaccine (3 - 2023-24 season) 02/06/2022     There are no preventive care reminders to display for this patient.  Lab Results  Component Value Date   TSH 0.918 09/10/2020   Lab Results  Component Value Date   WBC 4.6 04/04/2021   HGB 17.1 04/04/2021   HCT 51.2 (H) 04/04/2021   MCV 85 04/04/2021   PLT 221 04/04/2021   Lab Results  Component Value Date   NA 138 10/07/2021   K 4.8 10/07/2021   CO2 29 10/07/2021   GLUCOSE 76 10/07/2021   BUN 18 10/07/2021   CREATININE 1.51 (H) 10/07/2021   BILITOT 0.4 07/17/2021   ALKPHOS 72 07/17/2021   AST 34 07/17/2021   ALT 50 (H) 07/17/2021   PROT 7.3 07/17/2021   ALBUMIN 4.2 07/17/2021   CALCIUM 9.8 10/07/2021   EGFR 54 (L) 10/07/2021   Lab Results  Component Value Date   CHOL 158 10/07/2021   Lab Results  Component Value Date   HDL 52  10/07/2021   Lab Results  Component Value Date   LDLCALC 94 10/07/2021   Lab Results  Component Value Date   TRIG 62 10/07/2021   Lab Results  Component Value Date   CHOLHDL 3.0 10/07/2021   No results found for: "HGBA1C"    Assessment & Plan:   1. Essential hypertension, benign: Chronic and stable.  Blood pressure here at goal.  Continue lisinopril 20 mg daily.  Follow-up in 6 months for recheck.  2. Mixed hyperlipidemia: Chronic and stable.  Continue Lipitor 80 mg daily as well as omega-3 fatty acids.  Plan to recheck fasting labs at follow-up.  3. Seasonal allergic rhinitis due to pollen: Stable.  Continue Xyzal 5 mg daily, refilled today.  Continue Ryaltris nasal spray as well.  - levocetirizine (XYZAL ALLERGY 24HR) 5 MG tablet; Take 1 tablet (5 mg total) by mouth every evening.  Dispense: 90 tablet; Refill: 1  4. Dermatitis: Does not have rash currently, however states he will have a red, itchy rash after he works out in sweats.  It is mainly under his arms and across his chest wall.  Lotrimin cream worked well in the past, however we will switch over to triamcinolone cream as needed.  - triamcinolone cream (KENALOG) 0.1 %; Apply 1 Application topically 2 (two) times daily.  Dispense: 80 g; Refill: 0   Follow-up: No follow-ups on file.    Margarita Mail, DO

## 2022-10-12 ENCOUNTER — Ambulatory Visit: Payer: Managed Care, Other (non HMO) | Admitting: Internal Medicine

## 2022-10-12 ENCOUNTER — Encounter: Payer: Self-pay | Admitting: Internal Medicine

## 2022-10-12 VITALS — BP 132/90 | HR 69 | Temp 97.5°F | Resp 18 | Ht 73.0 in | Wt 259.6 lb

## 2022-10-12 DIAGNOSIS — J301 Allergic rhinitis due to pollen: Secondary | ICD-10-CM | POA: Diagnosis not present

## 2022-10-12 DIAGNOSIS — I10 Essential (primary) hypertension: Secondary | ICD-10-CM | POA: Diagnosis not present

## 2022-10-12 DIAGNOSIS — E782 Mixed hyperlipidemia: Secondary | ICD-10-CM

## 2022-10-12 MED ORDER — ATORVASTATIN CALCIUM 80 MG PO TABS: 80.0000 mg | ORAL_TABLET | Freq: Every day | ORAL | 1 refills | Status: DC

## 2022-10-12 MED ORDER — LISINOPRIL 20 MG PO TABS: 20.0000 mg | ORAL_TABLET | Freq: Every day | ORAL | 0 refills | Status: DC

## 2022-10-12 NOTE — Patient Instructions (Addendum)
It was great seeing you today!  Plan discussed at today's visit: -Blood work ordered today, results will be uploaded to MyChart.  -Recommend using Flonase (steroid) and Astelin (anti-histamine) nasal sprays over the counter. Also recommend Zaditor eye drop for allergies.   Follow up in: 6 months   Take care and let us know if you have any questions or concerns prior to your next visit.  Dr. Caralee Ates

## 2022-10-14 LAB — COMPREHENSIVE METABOLIC PANEL
ALT: 56 IU/L — ABNORMAL HIGH (ref 0–44)
AST: 40 IU/L (ref 0–40)
Albumin/Globulin Ratio: 1.6 (ref 1.2–2.2)
Albumin: 4.4 g/dL (ref 3.8–4.9)
Alkaline Phosphatase: 68 IU/L (ref 44–121)
BUN/Creatinine Ratio: 9 (ref 9–20)
BUN: 14 mg/dL (ref 6–24)
Bilirubin Total: 0.8 mg/dL (ref 0.0–1.2)
CO2: 24 mmol/L (ref 20–29)
Calcium: 9.5 mg/dL (ref 8.7–10.2)
Chloride: 100 mmol/L (ref 96–106)
Creatinine, Ser: 1.61 mg/dL — ABNORMAL HIGH (ref 0.76–1.27)
Globulin, Total: 2.8 g/dL (ref 1.5–4.5)
Glucose: 84 mg/dL (ref 70–99)
Potassium: 4.7 mmol/L (ref 3.5–5.2)
Sodium: 140 mmol/L (ref 134–144)
Total Protein: 7.2 g/dL (ref 6.0–8.5)
eGFR: 50 mL/min/{1.73_m2} — ABNORMAL LOW (ref >59)

## 2022-10-14 LAB — LIPID PANEL
Chol/HDL Ratio: 3.4 ratio (ref 0.0–5.0)
Cholesterol, Total: 164 mg/dL (ref 100–199)
HDL: 48 mg/dL (ref >39)
LDL Chol Calc (NIH): 101 mg/dL — ABNORMAL HIGH (ref 0–99)
Triglycerides: 82 mg/dL (ref 0–149)
VLDL Cholesterol Cal: 15 mg/dL (ref 5–40)

## 2022-10-14 LAB — CBC WITH DIFFERENTIAL/PLATELET
Basophils Absolute: 0 10*3/uL (ref 0.0–0.2)
Basos: 1 %
EOS (ABSOLUTE): 0.1 10*3/uL (ref 0.0–0.4)
Eos: 2 %
Hematocrit: 52.5 % — ABNORMAL HIGH (ref 37.5–51.0)
Hemoglobin: 17.1 g/dL (ref 13.0–17.7)
Immature Grans (Abs): 0 10*3/uL (ref 0.0–0.1)
Immature Granulocytes: 0 %
Lymphocytes Absolute: 1.7 10*3/uL (ref 0.7–3.1)
Lymphs: 29 %
MCH: 28.2 pg (ref 26.6–33.0)
MCHC: 32.6 g/dL (ref 31.5–35.7)
MCV: 87 fL (ref 79–97)
Monocytes Absolute: 0.7 10*3/uL (ref 0.1–0.9)
Monocytes: 13 %
Neutrophils Absolute: 3.2 10*3/uL (ref 1.4–7.0)
Neutrophils: 55 %
Platelets: 231 10*3/uL (ref 150–450)
RBC: 6.07 x10E6/uL — ABNORMAL HIGH (ref 4.14–5.80)
RDW: 14 % (ref 11.6–15.4)
WBC: 5.7 10*3/uL (ref 3.4–10.8)

## 2023-03-30 ENCOUNTER — Other Ambulatory Visit: Payer: Self-pay | Admitting: Internal Medicine

## 2023-03-30 DIAGNOSIS — I10 Essential (primary) hypertension: Secondary | ICD-10-CM

## 2023-03-31 NOTE — Telephone Encounter (Signed)
Requested Prescriptions  Pending Prescriptions Disp Refills   lisinopril (ZESTRIL) 20 MG tablet [Pharmacy Med Name: LISINOPRIL 20MG  TABLETS] 90 tablet 0    Sig: TAKE 1 TABLET(20 MG) BY MOUTH DAILY     Cardiovascular:  ACE Inhibitors Failed - 03/30/2023 10:37 AM      Failed - Cr in normal range and within 180 days    Creatinine, Ser  Date Value Ref Range Status  10/13/2022 1.61 (H) 0.76 - 1.27 mg/dL Final         Failed - Last BP in normal range    BP Readings from Last 1 Encounters:  10/12/22 (!) 132/90         Passed - K in normal range and within 180 days    Potassium  Date Value Ref Range Status  10/13/2022 4.7 3.5 - 5.2 mmol/L Final         Passed - Patient is not pregnant      Passed - Valid encounter within last 6 months    Recent Outpatient Visits           5 months ago Essential hypertension, benign   [facility] Margarita Mail, DO   11 months ago Seasonal allergic rhinitis due to pollen   [facility] Margarita Mail, DO   1 year ago Essential hypertension, benign   [facility] Margarita Mail, DO   1 year ago Essential hypertension, benign   [facility] Margarita Mail, DO   2 years ago Mixed hyperlipidemia   [facility] Trey Sailors, [place]       Future Appointments             In 2 weeks Margarita Mail, DO [place] [facility], [facility]

## 2023-04-15 NOTE — Progress Notes (Signed)
Established Patient Office Visit  Subjective:  Patient ID: Chad Hendricks, male    DOB: 1965/09/14  Age: 57 y.o. MRN: [id number]  CC:  Chief Complaint  Patient presents with   Medical Management of Chronic Issues    HPI Chad Hendricks presents for follow-up on chronic medical conditions. Patient is doing well and reports no changes other than increase in hiccups and GERD symptoms. Noticed eating fried foods and drinking beer will cause hiccups for days so he has stopped these foods. He is also having GERD symptoms after drinking coffee so he switched to ginger tea.   Hypertension: -Medications: Lisinopril 20 mg -Patient is compliant with above medications and reports no side effects. -Checking BP at home (average): average 130-132/80 -Denies any SOB, CP, vision changes, LE edema or symptoms of hypotension -Diet: Likes to eat chicken, fish, some beef, usually skips dinner for protein shakes  -Exercise: weight lifting   HLD: -Medications: Lipitor 80 mg, Omega 3 fatty acids -Patient is compliant with above medications and reports no side effects.  -Last lipid panel: Lipid Panel     Component Value Date/Time   CHOL 164 10/13/2022 0815   TRIG 82 10/13/2022 0815   HDL 48 10/13/2022 0815   CHOLHDL 3.4 10/13/2022 0815   LDLCALC 101 (H) 10/13/2022 0815   LABVLDL 15 10/13/2022 0815   Hypogonadism/ED: -Testosterone injections from Urology - they check PSA, unable to view. Going again in a few weeks -Sildenafil as needed  NASH: -Worked up by GI -RUQ from 07/22/21 showing mild increase hepatic echogenicity consistent with fatty infiltration    CKD3a: -Last creatinine 5/24 1.61, GFR 50 -Discussed staying well hydrated and avoiding nephrotoxic agents  Seasonal Allergic Rhinitis: -Currently Xyzal 5 mg daily, Ryaltris nasal spray but nasal spray too expensive - will switch to Flonase and Astelin.   Health maintenance: -Colonoscopy: Colonoscopy 3/23, repeat in 7 years -  father diagnosed with colon cancer at 5 years old -Blood work UTD  Past Medical History:  Diagnosis Date   Allergy    Hyperlipidemia    Hypertension    Seasonal allergies    Wears contact lenses     Past Surgical History:  Procedure Laterality Date   BALLOON DILATION N/A 08/15/2021   Procedure: BALLOON DILATION;  Surgeon: Midge Minium, MD;  Location: [facility];  Service: Endoscopy;  Laterality: N/A;   COLONOSCOPY     COLONOSCOPY WITH PROPOFOL N/A 07/20/2016   Procedure: COLONOSCOPY WITH PROPOFOL;  Surgeon: Midge Minium, MD;  Location: [facility];  Service: Endoscopy;  Laterality: N/A;   COLONOSCOPY WITH PROPOFOL N/A 08/15/2021   Procedure: COLONOSCOPY WITH PROPOFOL;  Surgeon: Midge Minium, MD;  Location: [facility];  Service: Endoscopy;  Laterality: N/A;   ESOPHAGOGASTRODUODENOSCOPY (EGD) WITH PROPOFOL N/A 08/15/2021   Procedure: ESOPHAGOGASTRODUODENOSCOPY (EGD) WITH PROPOFOL;  Surgeon: Midge Minium, MD;  Location: [facility];  Service: Endoscopy;  Laterality: N/A;   POLYPECTOMY N/A 08/15/2021   Procedure: POLYPECTOMY;  Surgeon: Midge Minium, MD;  Location: [facility];  Service: Endoscopy;  Laterality: N/A;   VASECTOMY      Family History  Problem Relation Age of Onset   Cancer Mother    Kidney disease Mother    Kidney disease Father    Prostate cancer Brother    Prostate cancer Brother    Kidney disease Brother    Diabetes Brother    Diabetes Brother    Diabetes Brother    Hypertension Maternal Grandmother    Hypertension  Maternal Grandfather    Hypertension Paternal Grandmother    Hypertension Paternal Grandfather     Social History   Socioeconomic History   Marital status: Married    Spouse name: Not on file   Number of children: Not on file   Years of education: Not on file   Highest education level: Bachelor's degree (e.g., BA, AB, BS)  Occupational History   Not on file  Tobacco Use   Smoking status: Never    Smokeless tobacco: Never  Vaping Use   Vaping status: Never Used  Substance and Sexual Activity   Alcohol use: Yes    Alcohol/week: 1.0 standard drink of alcohol    Types: 1 Glasses of wine per week   Drug use: No   Sexual activity: Yes  Other Topics Concern   Not on file  Social History Narrative   Not on file   Social Determinants of Health   Financial Resource Strain: Low Risk  (04/16/2023)   Overall Financial Resource Strain (CARDIA)    Difficulty of Paying Living Expenses: Not hard at all  Food Insecurity: No Food Insecurity (04/16/2023)   Hunger Vital Sign    Worried About Running Out of Food in the Last Year: Never true    Ran Out of Food in the Last Year: Never true  Transportation Needs: No Transportation Needs (04/16/2023)   PRAPARE - Administrator, Civil Service (Medical): No    Lack of Transportation (Non-Medical): No  Physical Activity: Sufficiently Active (04/16/2023)   Exercise Vital Sign    Days of Exercise per Week: 4 days    Minutes of Exercise per Session: 120 min  Stress: No Stress Concern Present (04/16/2023)   [employer] of Occupational Health - Occupational Stress Questionnaire    Feeling of Stress : Not at all  Social Connections: Socially Integrated (04/16/2023)   Social Connection and Isolation Panel [NHANES]    Frequency of Communication with Friends and Family: More than three times a week    Frequency of Social Gatherings with Friends and Family: Once a week    Attends Religious Services: More than 4 times per year    Active Member of [employer] or Organizations: Yes    Attends Engineer, structural: More than 4 times per year    Marital Status: Married  Catering manager Violence: Not on file    Outpatient Medications Prior to Visit  Medication Sig Dispense Refill   atorvastatin (LIPITOR) 80 MG tablet Take 1 tablet (80 mg total) by mouth daily. 90 tablet 1   Cholecalciferol (VITAMIN D3) 5000 units CAPS Take 1 capsule by  mouth daily.     co-enzyme Q-10 50 MG capsule Take 50 mg by mouth daily.     ketoconazole (NIZORAL) 2 % cream Apply 1 application. topically daily. 15 g 0   lisinopril (ZESTRIL) 20 MG tablet TAKE 1 TABLET(20 MG) BY MOUTH DAILY 90 tablet 0   Multiple Vitamins-Minerals (MULTIVITAMIN ADULT PO) Take 1 capsule by mouth daily.     Omega-3 Fatty Acids (OMEGA 3 500) 500 MG CAPS Take 500 mg by mouth daily.     RYALTRIS 665-25 MCG/ACT SUSP SHAKE LIQUID AND USE 2 SPRAYS IN EACH NOSTRIL TWICE DAILY 29 g 1   sildenafil (REVATIO) 20 MG tablet SMARTSIG:1-5 Tablet(s) By Mouth Daily PRN     testosterone cypionate (DEPOTESTOSTERONE CYPIONATE) 200 MG/ML injection Inject 1 mL into the muscle every 14 (fourteen) days.      triamcinolone cream (KENALOG) 0.1 %  Apply 1 Application topically 2 (two) times daily. 80 g 0   levocetirizine (XYZAL ALLERGY 24HR) 5 MG tablet Take 1 tablet (5 mg total) by mouth every evening. 90 tablet 1   No facility-administered medications prior to visit.    Allergies  Allergen Reactions   Latex Rash    Only if wears gloves for extended period    ROS Review of Systems  Constitutional:  Negative for chills and fever.  Eyes:  Negative for visual disturbance.  Respiratory:  Negative for shortness of breath.   Cardiovascular:  Negative for chest pain and palpitations.  Gastrointestinal:  Negative for abdominal pain.  Neurological:  Negative for dizziness.      Objective:    Physical Exam Constitutional:      Appearance: Normal appearance.  HENT:     Head: Normocephalic and atraumatic.     Mouth/Throat:     Mouth: Mucous membranes are moist.     Pharynx: Oropharynx is clear.  Eyes:     Extraocular Movements: Extraocular movements intact.     Conjunctiva/sclera: Conjunctivae normal.     Pupils: Pupils are equal, round, and reactive to light.  Cardiovascular:     Rate and Rhythm: Normal rate and regular rhythm.  Pulmonary:     Effort: Pulmonary effort is normal.      Breath sounds: Normal breath sounds.  Musculoskeletal:     Right lower leg: No edema.     Left lower leg: No edema.  Skin:    General: Skin is warm and dry.  Neurological:     General: No focal deficit present.     Mental Status: He is alert. Mental status is at baseline.  Psychiatric:        Mood and Affect: Mood normal.        Behavior: Behavior normal.     BP 132/84   Pulse 76   Temp 97.9 F (36.6 C) (Oral)   Resp 16   Ht 6\' 1"  (1.854 m)   Wt 253 lb 3.2 oz (114.9 kg)   SpO2 96%   BMI 33.41 kg/m  Wt Readings from Last 3 Encounters:  04/16/23 253 lb 3.2 oz (114.9 kg)  10/12/22 259 lb 9.6 oz (117.8 kg)  04/06/22 262 lb 9.6 oz (119.1 kg)     Health Maintenance Due  Topic Date Due   INFLUENZA VACCINE  01/07/2023   COVID-19 Vaccine (3 - 2023-24 season) 02/07/2023      There are no preventive care reminders to display for this patient.  Lab Results  Component Value Date   TSH 0.918 09/10/2020   Lab Results  Component Value Date   WBC 5.7 10/13/2022   HGB 17.1 10/13/2022   HCT 52.5 (H) 10/13/2022   MCV 87 10/13/2022   PLT 231 10/13/2022   Lab Results  Component Value Date   NA 140 10/13/2022   K 4.7 10/13/2022   CO2 24 10/13/2022   GLUCOSE 84 10/13/2022   BUN 14 10/13/2022   CREATININE 1.61 (H) 10/13/2022   BILITOT 0.8 10/13/2022   ALKPHOS 68 10/13/2022   AST 40 10/13/2022   ALT 56 (H) 10/13/2022   PROT 7.2 10/13/2022   ALBUMIN 4.4 10/13/2022   CALCIUM 9.5 10/13/2022   EGFR 50 (L) 10/13/2022   Lab Results  Component Value Date   CHOL 164 10/13/2022   Lab Results  Component Value Date   HDL 48 10/13/2022   Lab Results  Component Value Date   LDLCALC 101 (H) 10/13/2022  Lab Results  Component Value Date   TRIG 82 10/13/2022   Lab Results  Component Value Date   CHOLHDL 3.4 10/13/2022   No results found for: "HGBA1C"    Assessment & Plan:   1. Essential hypertension, benign: Blood pressure stable here today, no changes made to  medications and appropriate refills sent to pharmacy.   - lisinopril (ZESTRIL) 20 MG tablet; Take 1 tablet (20 mg total) by mouth daily.  Dispense: 90 tablet; Refill: 1  2. Mixed hyperlipidemia: Stable on statin.   - atorvastatin (LIPITOR) 80 MG tablet; Take 1 tablet (80 mg total) by mouth daily.  Dispense: 90 tablet; Refill: 1  3. Gastroesophageal reflux disease, unspecified whether esophagitis present: Will treat with PPI PRN.  - pantoprazole (PROTONIX) 20 MG tablet; Take 1 tablet (20 mg total) by mouth daily as needed for heartburn or indigestion.  Dispense: 90 tablet; Refill: 1   Follow-up: Return in about 6 months (around 10/14/2023).    Margarita Mail, DO

## 2023-04-16 ENCOUNTER — Other Ambulatory Visit: Payer: Self-pay | Admitting: Internal Medicine

## 2023-04-16 ENCOUNTER — Other Ambulatory Visit: Payer: Self-pay

## 2023-04-16 ENCOUNTER — Ambulatory Visit: Payer: Managed Care, Other (non HMO) | Admitting: Internal Medicine

## 2023-04-16 VITALS — BP 132/84 | HR 76 | Temp 97.9°F | Resp 16 | Ht 73.0 in | Wt 253.2 lb

## 2023-04-16 DIAGNOSIS — K219 Gastro-esophageal reflux disease without esophagitis: Secondary | ICD-10-CM | POA: Diagnosis not present

## 2023-04-16 DIAGNOSIS — I10 Essential (primary) hypertension: Secondary | ICD-10-CM

## 2023-04-16 DIAGNOSIS — E782 Mixed hyperlipidemia: Secondary | ICD-10-CM

## 2023-04-16 MED ORDER — LISINOPRIL 20 MG PO TABS: 20.0000 mg | ORAL_TABLET | Freq: Every day | ORAL | 1 refills | Status: DC

## 2023-04-16 MED ORDER — ATORVASTATIN CALCIUM 80 MG PO TABS: 80.0000 mg | ORAL_TABLET | Freq: Every day | ORAL | 1 refills | Status: DC

## 2023-04-16 MED ORDER — PANTOPRAZOLE SODIUM 20 MG PO TBEC
20.0000 mg | DELAYED_RELEASE_TABLET | Freq: Every day | ORAL | 1 refills | Status: DC | PRN
Start: 2023-04-16 — End: 2023-10-29

## 2023-04-16 MED ORDER — PANTOPRAZOLE SODIUM 20 MG PO TBEC
20.0000 mg | DELAYED_RELEASE_TABLET | Freq: Every day | ORAL | 1 refills | Status: DC | PRN
Start: 2023-04-16 — End: 2023-04-16

## 2023-04-16 NOTE — Telephone Encounter (Signed)
Visit today- f/u due 6 month- will approve 90 day Rx Requested Prescriptions  Pending Prescriptions Disp Refills   pantoprazole (PROTONIX) 20 MG tablet [Pharmacy Med Name: PANTOPRAZOLE 20MG  TABLETS] 90 tablet     Sig: TAKE 1 TABLET(20 MG) BY MOUTH DAILY AS NEEDED FOR HEARTBURN OR INDIGESTION     Gastroenterology: Proton Pump Inhibitors Passed - 04/16/2023  2:29 PM      Passed - Valid encounter within last 12 months    Recent Outpatient Visits           Today Essential hypertension, benign   [facility] Margarita Mail, DO   6 months ago Essential hypertension, benign   [facility] Margarita Mail, DO   1 year ago Seasonal allergic rhinitis due to pollen   [facility] Margarita Mail, DO   1 year ago Essential hypertension, benign   [facility] Margarita Mail, DO   2 years ago Essential hypertension, benign   [facility] Margarita Mail, DO       Future Appointments             In 6 months Margarita Mail, DO [facility], [facility]

## 2023-04-16 NOTE — Patient Instructions (Addendum)
It was great seeing you today!  Plan discussed at today's visit: -Medications refilled today -Get Astropro nasal spray and Flonase nasal spray to use together to make a Ryaltris spray. Use nasal saline  -Prepare for fasting labs for next time  Follow up in: 6 months   Take care and let us know if you have any questions or concerns prior to your next visit.  Dr. Caralee Ates

## 2023-10-15 ENCOUNTER — Ambulatory Visit: Payer: Self-pay | Admitting: Internal Medicine

## 2023-10-29 ENCOUNTER — Other Ambulatory Visit: Payer: Self-pay

## 2023-10-29 ENCOUNTER — Ambulatory Visit: Admitting: Internal Medicine

## 2023-10-29 ENCOUNTER — Encounter: Payer: Self-pay | Admitting: Internal Medicine

## 2023-10-29 VITALS — BP 132/80 | HR 64 | Temp 98.2°F | Resp 16 | Ht 73.0 in | Wt 264.0 lb

## 2023-10-29 DIAGNOSIS — E782 Mixed hyperlipidemia: Secondary | ICD-10-CM | POA: Diagnosis not present

## 2023-10-29 DIAGNOSIS — I10 Essential (primary) hypertension: Secondary | ICD-10-CM | POA: Diagnosis not present

## 2023-10-29 DIAGNOSIS — E559 Vitamin D deficiency, unspecified: Secondary | ICD-10-CM

## 2023-10-29 DIAGNOSIS — N1831 Chronic kidney disease, stage 3a: Secondary | ICD-10-CM

## 2023-10-29 DIAGNOSIS — K219 Gastro-esophageal reflux disease without esophagitis: Secondary | ICD-10-CM

## 2023-10-29 MED ORDER — ATORVASTATIN CALCIUM 80 MG PO TABS: 80.0000 mg | ORAL_TABLET | Freq: Every day | ORAL | 1 refills | Status: DC

## 2023-10-29 MED ORDER — LISINOPRIL 20 MG PO TABS: 20.0000 mg | ORAL_TABLET | Freq: Every day | ORAL | 1 refills | Status: DC

## 2023-10-29 MED ORDER — PANTOPRAZOLE SODIUM 20 MG PO TBEC
20.0000 mg | DELAYED_RELEASE_TABLET | Freq: Every day | ORAL | 1 refills | Status: DC | PRN
Start: 2023-10-29 — End: 2024-05-01

## 2023-10-29 NOTE — Progress Notes (Signed)
 Established Patient Office Visit  Subjective:  Patient ID: Chad Hendricks, male    DOB: Dec 27, 1965  Age: 58 y.o. MRN: [id number]  CC:  Chief Complaint  Patient presents with   Medical Management of Chronic Issues    6 month recheck    HPI Chad Hendricks presents for follow-up on chronic medical conditions.   Discussed the use of AI scribe software for clinical note transcription with the patient, who gave verbal consent to proceed.  History of Present Illness Chad Hendricks is a 58 year old male with hypertension and chronic kidney disease who presents for a routine follow-up visit.  His blood pressure is controlled at 132/80 mmHg with lisinopril . He is concerned about kidney function, noting a GFR of 50, and asks about lisinopril 's protective effects on the kidneys, considering his family history of chronic kidney disease. He consumes about a gallon of water  daily, avoids sodas, and drinks coffee without sugar.  He has acid reflux managed with pantoprazole  20 mg since December 2024, with no current symptoms. He has hyperlipidemia and takes Lipitor, with lab work including a lipid panel, CBC, and CMP pending.  A lipoma is present without changes in size or symptoms, with a family history of lipomas. He takes a multivitamin, vitamin D3, fish oil, and a creatine supplement for weight loss at 1.5 grams.   Hypertension: -Medications: Lisinopril  20 mg -Patient is compliant with above medications and reports no side effects. -Checking BP at home (average): average 130-132/80 -Denies any SOB, CP, vision changes, LE edema or symptoms of hypotension -Diet: Likes to eat chicken, fish, some beef, usually skips dinner for protein shakes  -Exercise: weight lifting   HLD: -Medications: Lipitor 80 mg, Omega 3 fatty acids -Patient is compliant with above medications and reports no side effects.  -Last lipid panel: Lipid Panel     Component Value Date/Time   CHOL 164 10/13/2022  0815   TRIG 82 10/13/2022 0815   HDL 48 10/13/2022 0815   CHOLHDL 3.4 10/13/2022 0815   LDLCALC 101 (H) 10/13/2022 0815   LABVLDL 15 10/13/2022 0815   Hypogonadism/ED: -Testosterone injections from Urology - they check PSA, unable to view.  -Sildenafil as needed  CKD3a: -Last creatinine 5/24 1.61, GFR 50 -Discussed staying well hydrated and avoiding nephrotoxic agents  Seasonal Allergic Rhinitis: -Currently Xyzal  5 mg daily, Ryaltris  nasal spray but nasal spray too expensive - will switch to Flonase  and Astelin.   Health maintenance: -Colonoscopy: Colonoscopy 3/23, repeat in 7 years - father diagnosed with colon cancer at 24 years old -Blood work due -Discussed Prevnar 20 vaccine, will discuss again in the fall   Past Medical History:  Diagnosis Date   Allergy    Hyperlipidemia    Hypertension    Seasonal allergies    Wears contact lenses     Past Surgical History:  Procedure Laterality Date   BALLOON DILATION N/A 08/15/2021   Procedure: BALLOON DILATION;  Surgeon: Marnee Sink, MD;  Location: [facility];  Service: Endoscopy;  Laterality: N/A;   COLONOSCOPY     COLONOSCOPY WITH PROPOFOL  N/A 07/20/2016   Procedure: COLONOSCOPY WITH PROPOFOL ;  Surgeon: Marnee Sink, MD;  Location: [facility];  Service: Endoscopy;  Laterality: N/A;   COLONOSCOPY WITH PROPOFOL  N/A 08/15/2021   Procedure: COLONOSCOPY WITH PROPOFOL ;  Surgeon: Marnee Sink, MD;  Location: [facility];  Service: Endoscopy;  Laterality: N/A;   ESOPHAGOGASTRODUODENOSCOPY (EGD) WITH PROPOFOL  N/A 08/15/2021   Procedure: ESOPHAGOGASTRODUODENOSCOPY (EGD) WITH PROPOFOL ;  Surgeon: Marnee Sink, MD;  Location: [facility];  Service: Endoscopy;  Laterality: N/A;   POLYPECTOMY N/A 08/15/2021   Procedure: POLYPECTOMY;  Surgeon: Marnee Sink, MD;  Location: [facility];  Service: Endoscopy;  Laterality: N/A;   VASECTOMY      Family History  Problem Relation Age of Onset   Cancer Mother     Kidney disease Mother    Kidney disease Father    Prostate cancer Brother    Prostate cancer Brother    Kidney disease Brother    Diabetes Brother    Diabetes Brother    Diabetes Brother    Hypertension Maternal Grandmother    Hypertension Maternal Grandfather    Hypertension Paternal Grandmother    Hypertension Paternal Grandfather     Social History   Socioeconomic History   Marital status: Married    Spouse name: Not on file   Number of children: Not on file   Years of education: Not on file   Highest education level: Bachelor's degree (e.g., BA, AB, BS)  Occupational History   Not on file  Tobacco Use   Smoking status: Never   Smokeless tobacco: Never  Vaping Use   Vaping status: Never Used  Substance and Sexual Activity   Alcohol use: Yes    Alcohol/week: 1.0 standard drink of alcohol    Types: 1 Glasses of wine per week   Drug use: No   Sexual activity: Yes  Other Topics Concern   Not on file  Social History Narrative   Not on file   Social Drivers of Health   Financial Resource Strain: Low Risk  (04/16/2023)   Overall Financial Resource Strain (CARDIA)    Difficulty of Paying Living Expenses: Not hard at all  Food Insecurity: No Food Insecurity (04/16/2023)   Hunger Vital Sign    Worried About Running Out of Food in the Last Year: Never true    Ran Out of Food in the Last Year: Never true  Transportation Needs: No Transportation Needs (04/16/2023)   PRAPARE - Administrator, Civil Service (Medical): No    Lack of Transportation (Non-Medical): No  Physical Activity: Sufficiently Active (04/16/2023)   Exercise Vital Sign    Days of Exercise per Week: 4 days    Minutes of Exercise per Session: 120 min  Stress: No Stress Concern Present (04/16/2023)   [employer] of Occupational Health - Occupational Stress Questionnaire    Feeling of Stress : Not at all  Social Connections: Socially Integrated (04/16/2023)   Social Connection and  Isolation Panel [NHANES]    Frequency of Communication with Friends and Family: More than three times a week    Frequency of Social Gatherings with Friends and Family: Once a week    Attends Religious Services: More than 4 times per year    Active Member of [employer] or Organizations: Yes    Attends Engineer, structural: More than 4 times per year    Marital Status: Married  Catering manager Violence: Not on file    Outpatient Medications Prior to Visit  Medication Sig Dispense Refill   atorvastatin  (LIPITOR) 80 MG tablet Take 1 tablet (80 mg total) by mouth daily. 90 tablet 1   Cholecalciferol (VITAMIN D3) 5000 units CAPS Take 1 capsule by mouth daily.     co-enzyme Q-10 50 MG capsule Take 50 mg by mouth daily.     ketoconazole  (NIZORAL ) 2 % cream Apply 1 application. topically daily. 15 g 0  lisinopril  (ZESTRIL ) 20 MG tablet Take 1 tablet (20 mg total) by mouth daily. 90 tablet 1   Multiple Vitamins-Minerals (MULTIVITAMIN ADULT PO) Take 1 capsule by mouth daily.     Omega-3 Fatty Acids (OMEGA 3 500) 500 MG CAPS Take 500 mg by mouth daily.     pantoprazole  (PROTONIX ) 20 MG tablet Take 1 tablet (20 mg total) by mouth daily as needed for heartburn or indigestion. 90 tablet 1   RYALTRIS  665-25 MCG/ACT SUSP SHAKE LIQUID AND USE 2 SPRAYS IN EACH NOSTRIL TWICE DAILY 29 g 1   sildenafil (REVATIO) 20 MG tablet SMARTSIG:1-5 Tablet(s) By Mouth Daily PRN     testosterone cypionate (DEPOTESTOSTERONE CYPIONATE) 200 MG/ML injection Inject 1 mL into the muscle every 14 (fourteen) days.      triamcinolone  cream (KENALOG ) 0.1 % Apply 1 Application topically 2 (two) times daily. 80 g 0   No facility-administered medications prior to visit.    Allergies  Allergen Reactions   Latex Rash    Only if wears gloves for extended period    ROS Review of Systems  Constitutional:  Negative for chills and fever.  Eyes:  Negative for visual disturbance.  Respiratory:  Negative for shortness of breath.    Cardiovascular:  Negative for chest pain and palpitations.  Gastrointestinal:  Negative for abdominal pain.  Neurological:  Negative for dizziness.      Objective:    Physical Exam Constitutional:      Appearance: Normal appearance.  HENT:     Head: Normocephalic and atraumatic.     Mouth/Throat:     Mouth: Mucous membranes are moist.     Pharynx: Oropharynx is clear.  Eyes:     Extraocular Movements: Extraocular movements intact.     Conjunctiva/sclera: Conjunctivae normal.     Pupils: Pupils are equal, round, and reactive to light.  Neck:     Comments: No thyromegaly  Cardiovascular:     Rate and Rhythm: Normal rate and regular rhythm.  Pulmonary:     Effort: Pulmonary effort is normal.     Breath sounds: Normal breath sounds.  Musculoskeletal:     Cervical back: No tenderness.     Right lower leg: No edema.     Left lower leg: No edema.  Lymphadenopathy:     Cervical: No cervical adenopathy.  Skin:    General: Skin is warm and dry.  Neurological:     General: No focal deficit present.     Mental Status: He is alert. Mental status is at baseline.  Psychiatric:        Mood and Affect: Mood normal.        Behavior: Behavior normal.     BP 132/80   Pulse 64   Temp 98.2 F (36.8 C) (Oral)   Resp 16   Ht 6\' 1"  (1.854 m)   Wt 264 lb (119.7 kg)   SpO2 99%   BMI 34.83 kg/m  Wt Readings from Last 3 Encounters:  10/29/23 264 lb (119.7 kg)  04/16/23 253 lb 3.2 oz (114.9 kg)  10/12/22 259 lb 9.6 oz (117.8 kg)     Health Maintenance Due  Topic Date Due   Pneumococcal Vaccine 49-51 Years old (1 of 2 - PCV) Never done   COVID-19 Vaccine (3 - 2024-25 season) 02/07/2023      There are no preventive care reminders to display for this patient.  Lab Results  Component Value Date   TSH 0.918 09/10/2020   Lab Results  Component  Value Date   WBC 5.7 10/13/2022   HGB 17.1 10/13/2022   HCT 52.5 (H) 10/13/2022   MCV 87 10/13/2022   PLT 231 10/13/2022   Lab  Results  Component Value Date   NA 140 10/13/2022   K 4.7 10/13/2022   CO2 24 10/13/2022   GLUCOSE 84 10/13/2022   BUN 14 10/13/2022   CREATININE 1.61 (H) 10/13/2022   BILITOT 0.8 10/13/2022   ALKPHOS 68 10/13/2022   AST 40 10/13/2022   ALT 56 (H) 10/13/2022   PROT 7.2 10/13/2022   ALBUMIN 4.4 10/13/2022   CALCIUM  9.5 10/13/2022   EGFR 50 (L) 10/13/2022   Lab Results  Component Value Date   CHOL 164 10/13/2022   Lab Results  Component Value Date   HDL 48 10/13/2022   Lab Results  Component Value Date   LDLCALC 101 (H) 10/13/2022   Lab Results  Component Value Date   TRIG 82 10/13/2022   Lab Results  Component Value Date   CHOLHDL 3.4 10/13/2022   No results found for: "HGBA1C"    Assessment & Plan:   Assessment & Plan Stage 3A Chronic Kidney Disease Chronic kidney disease stage 3A with GFR 50. Emphasized renal protection with lisinopril  and hydration. Discussed potential future use of SGLT2 inhibitors if kidney function declines. - Continue lisinopril  for renal protection. - Encourage adequate hydration. - Avoid creatine supplements. - Monitor kidney function with regular labs.  Hypertension Blood pressure well controlled with current regimen. Lisinopril  used for hypertension management and renal protection. - Continue lisinopril . - Monitor blood pressure regularly.  HLD  - Repeat fasting lipid panel - Continue statin, refill.   Gastroesophageal Reflux Disease (GERD) GERD symptoms well controlled. Discussed risks of long-term acid reflux medication use. Advised as-needed use to minimize risks. - Continue pantoprazole  as needed for GERD symptoms. - Monitor for recurrence of symptoms when not using medication.  Lipoma Asymptomatic lipoma present. No intervention unless symptomatic. - Monitor lipoma for changes in size or symptoms. - Refer to surgery if lipoma becomes symptomatic.  General Health Maintenance Reviewed immunization status and  preventative health measures. Discussed pneumonia vaccine starting at age 67. - Consider pneumonia vaccine, especially in the fall. - Ensure up-to-date with routine screenings and preventative measures.  - pantoprazole  (PROTONIX ) 20 MG tablet; Take 1 tablet (20 mg total) by mouth daily as needed for heartburn or indigestion.  Dispense: 90 tablet; Refill: 1 - atorvastatin  (LIPITOR) 80 MG tablet; Take 1 tablet (80 mg total) by mouth daily.  Dispense: 90 tablet; Refill: 1 - Lipid Profile - CBC w/Diff/Platelet - Comprehensive Metabolic Panel (CMET) - lisinopril  (ZESTRIL ) 20 MG tablet; Take 1 tablet (20 mg total) by mouth daily.  Dispense: 90 tablet; Refill: 1 - Vitamin D (25 hydroxy)   Follow-up: Return in about 6 months (around 04/30/2024).    Rockney Cid, DO

## 2023-11-11 ENCOUNTER — Ambulatory Visit: Payer: Self-pay | Admitting: Internal Medicine

## 2023-11-11 LAB — CBC WITH DIFFERENTIAL/PLATELET
Basophils Absolute: 0 10*3/uL (ref 0.0–0.2)
Basos: 0 %
EOS (ABSOLUTE): 0.1 10*3/uL (ref 0.0–0.4)
Eos: 3 %
Hematocrit: 55.1 % — ABNORMAL HIGH (ref 37.5–51.0)
Hemoglobin: 17.8 g/dL — ABNORMAL HIGH (ref 13.0–17.7)
Immature Grans (Abs): 0 10*3/uL (ref 0.0–0.1)
Immature Granulocytes: 0 %
Lymphocytes Absolute: 1.6 10*3/uL (ref 0.7–3.1)
Lymphs: 35 %
MCH: 28.8 pg (ref 26.6–33.0)
MCHC: 32.3 g/dL (ref 31.5–35.7)
MCV: 89 fL (ref 79–97)
Monocytes Absolute: 0.4 10*3/uL (ref 0.1–0.9)
Monocytes: 9 %
Neutrophils Absolute: 2.4 10*3/uL (ref 1.4–7.0)
Neutrophils: 53 %
Platelets: 207 10*3/uL (ref 150–450)
RBC: 6.19 x10E6/uL — ABNORMAL HIGH (ref 4.14–5.80)
RDW: 12.9 % (ref 11.6–15.4)
WBC: 4.7 10*3/uL (ref 3.4–10.8)

## 2023-11-11 LAB — COMPREHENSIVE METABOLIC PANEL WITH GFR
ALT: 57 IU/L — ABNORMAL HIGH (ref 0–44)
AST: 40 IU/L (ref 0–40)
Albumin: 4.4 g/dL (ref 3.8–4.9)
Alkaline Phosphatase: 64 IU/L (ref 44–121)
BUN/Creatinine Ratio: 12 (ref 9–20)
BUN: 21 mg/dL (ref 6–24)
Bilirubin Total: 0.7 mg/dL (ref 0.0–1.2)
CO2: 25 mmol/L (ref 20–29)
Calcium: 9.6 mg/dL (ref 8.7–10.2)
Chloride: 101 mmol/L (ref 96–106)
Creatinine, Ser: 1.74 mg/dL — ABNORMAL HIGH (ref 0.76–1.27)
Globulin, Total: 2.4 g/dL (ref 1.5–4.5)
Glucose: 76 mg/dL (ref 70–99)
Potassium: 4.5 mmol/L (ref 3.5–5.2)
Sodium: 141 mmol/L (ref 134–144)
Total Protein: 6.8 g/dL (ref 6.0–8.5)
eGFR: 45 mL/min/{1.73_m2} — ABNORMAL LOW (ref >59)

## 2023-11-11 LAB — LIPID PANEL
Chol/HDL Ratio: 3.4 ratio (ref 0.0–5.0)
Cholesterol, Total: 185 mg/dL (ref 100–199)
HDL: 54 mg/dL (ref >39)
LDL Chol Calc (NIH): 112 mg/dL — ABNORMAL HIGH (ref 0–99)
Triglycerides: 107 mg/dL (ref 0–149)
VLDL Cholesterol Cal: 19 mg/dL (ref 5–40)

## 2023-11-11 LAB — VITAMIN D 25 HYDROXY (VIT D DEFICIENCY, FRACTURES): Vit D, 25-Hydroxy: 38.1 ng/mL (ref 30.0–100.0)

## 2023-12-08 ENCOUNTER — Other Ambulatory Visit: Payer: Self-pay | Admitting: Internal Medicine

## 2023-12-08 DIAGNOSIS — N1831 Chronic kidney disease, stage 3a: Secondary | ICD-10-CM

## 2023-12-08 DIAGNOSIS — I10 Essential (primary) hypertension: Secondary | ICD-10-CM

## 2023-12-09 NOTE — Telephone Encounter (Signed)
 Requested Prescriptions  Refused Prescriptions Disp Refills   lisinopril  (ZESTRIL ) 20 MG tablet [Pharmacy Med Name: LISINOPRIL  20MG  TABLETS] 90 tablet 1    Sig: TAKE 1 TABLET(20 MG) BY MOUTH DAILY     Cardiovascular:  ACE Inhibitors Failed - 12/09/2023  5:20 PM      Failed - Cr in normal range and within 180 days    Creatinine, Ser  Date Value Ref Range Status  11/10/2023 1.74 (H) 0.76 - 1.27 mg/dL Final         Passed - K in normal range and within 180 days    Potassium  Date Value Ref Range Status  11/10/2023 4.5 3.5 - 5.2 mmol/L Final         Passed - Patient is not pregnant      Passed - Last BP in normal range    BP Readings from Last 1 Encounters:  10/29/23 132/80         Passed - Valid encounter within last 6 months    Recent Outpatient Visits           1 month ago Essential hypertension, benign   [facility] Bernardo Fend, DO       Future Appointments             In 4 months Bernardo Fend, DO [facility], [facility]

## 2024-04-11 ENCOUNTER — Other Ambulatory Visit: Payer: Self-pay | Admitting: Internal Medicine

## 2024-04-11 DIAGNOSIS — I10 Essential (primary) hypertension: Secondary | ICD-10-CM

## 2024-04-11 DIAGNOSIS — N1831 Chronic kidney disease, stage 3a: Secondary | ICD-10-CM

## 2024-04-13 NOTE — Telephone Encounter (Signed)
 Requested Prescriptions  Pending Prescriptions Disp Refills   lisinopril  (ZESTRIL ) 20 MG tablet [Pharmacy Med Name: Lisinopril  20 MG Oral Tablet] 90 tablet 0    Sig: TAKE 1 TABLET BY MOUTH DAILY     Cardiovascular:  ACE Inhibitors Failed - 04/13/2024  1:55 PM      Failed - Cr in normal range and within 180 days    Creatinine, Ser  Date Value Ref Range Status  11/10/2023 1.74 (H) 0.76 - 1.27 mg/dL Final         Passed - K in normal range and within 180 days    Potassium  Date Value Ref Range Status  11/10/2023 4.5 3.5 - 5.2 mmol/L Final         Passed - Patient is not pregnant      Passed - Last BP in normal range    BP Readings from Last 1 Encounters:  10/29/23 132/80         Passed - Valid encounter within last 6 months    Recent Outpatient Visits           5 months ago Essential hypertension, benign   [facility] Bernardo Fend, DO       Future Appointments             In 2 weeks Bernardo Fend, DO [facility], [place]

## 2024-05-01 ENCOUNTER — Ambulatory Visit: Admitting: Internal Medicine

## 2024-05-01 ENCOUNTER — Other Ambulatory Visit: Payer: Self-pay

## 2024-05-01 VITALS — BP 130/82 | HR 78 | Temp 98.2°F | Resp 78 | Ht 73.0 in | Wt 262.6 lb

## 2024-05-01 DIAGNOSIS — H1013 Acute atopic conjunctivitis, bilateral: Secondary | ICD-10-CM

## 2024-05-01 DIAGNOSIS — J309 Allergic rhinitis, unspecified: Secondary | ICD-10-CM

## 2024-05-01 DIAGNOSIS — K219 Gastro-esophageal reflux disease without esophagitis: Secondary | ICD-10-CM | POA: Diagnosis not present

## 2024-05-01 DIAGNOSIS — E782 Mixed hyperlipidemia: Secondary | ICD-10-CM | POA: Diagnosis not present

## 2024-05-01 DIAGNOSIS — I10 Essential (primary) hypertension: Secondary | ICD-10-CM

## 2024-05-01 DIAGNOSIS — N1831 Chronic kidney disease, stage 3a: Secondary | ICD-10-CM | POA: Diagnosis not present

## 2024-05-01 MED ORDER — ATORVASTATIN CALCIUM 80 MG PO TABS: 80.0000 mg | ORAL_TABLET | Freq: Every day | ORAL | 1 refills | Status: AC

## 2024-05-01 MED ORDER — LISINOPRIL 20 MG PO TABS: 20.0000 mg | ORAL_TABLET | Freq: Every day | ORAL | 1 refills | Status: AC

## 2024-05-01 MED ORDER — PANTOPRAZOLE SODIUM 20 MG PO TBEC: 20.0000 mg | DELAYED_RELEASE_TABLET | Freq: Every day | ORAL | 1 refills | Status: AC | PRN

## 2024-05-01 NOTE — Progress Notes (Signed)
 Established Patient Office Visit  Subjective:  Patient ID: Chad Hendricks, male    DOB: 05-14-1966  Age: 57 y.o. MRN: [id number]  CC:  Chief Complaint  Patient presents with   Medical Management of Chronic Issues   URI    For 2 weeks, eyes matted together when awakening    HPI Chad Hendricks presents for follow-up on chronic medical conditions.   Discussed the use of AI scribe software for clinical note transcription with the patient, who gave verbal consent to proceed.  History of Present Illness  Chad Hendricks is a 58 year old male who presents with upper respiratory symptoms and eye irritation.  He has had upper respiratory symptoms for about 1.5 weeks. Initial congestion improved with Mucinex D, but he still has thick mucus, persistent cough, and bilateral facial pressure. He denies shortness of breath or wheezing. A brief earache at onset has resolved.  His eyes are constantly draining and itching with thick white discharge and morning crusting, plus some blurred vision. There is no pain with eye movement. Over-the-counter allergy eye drops (Alamay) have not helped.  His current medications are lisinopril , Lipitor, and pantoprazole . 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   Hypertension: -Medications: Lisinopril  20 mg -Patient is compliant with above medications and reports no side effects. -Checking BP at home (average): average 130-132/80 -Denies any SOB, CP, vision changes, LE edema or symptoms of hypotension -Diet: Likes to eat chicken, fish, some beef, usually skips dinner for protein shakes  -Exercise: weight lifting   HLD: -Medications: Lipitor 80 mg, Omega 3 fatty acids -Patient is compliant with above medications and reports no side effects.  -Last lipid panel: Lipid Panel     Component Value Date/Time   CHOL 185 11/10/2023 0812   TRIG 107 11/10/2023 0812   HDL 54 11/10/2023 0812   CHOLHDL 3.4 11/10/2023 0812    LDLCALC 112 (H) 11/10/2023 0812   LABVLDL 19 11/10/2023 0812   Hypogonadism/ED: -Testosterone injections from Urology - they check PSA, unable to view.  -Sildenafil as needed  CKD3a: -Last creatinine 5/25 1.74, GFR 45 -Discussed staying well hydrated and avoiding nephrotoxic agents  Seasonal Allergic Rhinitis: -Currently Xyzal  5 mg daily, Ryaltris  nasal spray but nasal spray too expensive - will switch to Flonase  and Astelin.   Health maintenance: -Colonoscopy: Colonoscopy 3/23, repeat in 7 years - father diagnosed with colon cancer at 8 years old -Blood work UTD -Discussed Prevnar 20 vaccine, declines today  Past Medical History:  Diagnosis Date   Allergy    Hyperlipidemia    Hypertension    Seasonal allergies    Wears contact lenses     Past Surgical History:  Procedure Laterality Date   BALLOON DILATION N/A 08/15/2021   Procedure: BALLOON DILATION;  Surgeon: Jinny Carmine, MD;  Location: [facility];  Service: Endoscopy;  Laterality: N/A;   COLONOSCOPY     COLONOSCOPY WITH PROPOFOL  N/A 07/20/2016   Procedure: COLONOSCOPY WITH PROPOFOL ;  Surgeon: Carmine Jinny, MD;  Location: [facility];  Service: Endoscopy;  Laterality: N/A;   COLONOSCOPY WITH PROPOFOL  N/A 08/15/2021   Procedure: COLONOSCOPY WITH PROPOFOL ;  Surgeon: Jinny Carmine, MD;  Location: [facility];  Service: Endoscopy;  Laterality: N/A;   ESOPHAGOGASTRODUODENOSCOPY (EGD) WITH PROPOFOL  N/A 08/15/2021   Procedure: ESOPHAGOGASTRODUODENOSCOPY (EGD) WITH PROPOFOL ;  Surgeon: Jinny Carmine, MD;  Location: [facility];  Service: Endoscopy;  Laterality: N/A;   POLYPECTOMY N/A 08/15/2021   Procedure: POLYPECTOMY;  Surgeon: Jinny Carmine, MD;  Location: [facility];  Service: Endoscopy;  Laterality: N/A;   VASECTOMY      Family History  Problem Relation Age of Onset   Cancer Mother    Kidney disease Mother    Kidney disease Father    Prostate cancer Brother    Prostate cancer  Brother    Kidney disease Brother    Diabetes Brother    Diabetes Brother    Diabetes Brother    Hypertension Maternal Grandmother    Hypertension Maternal Grandfather    Hypertension Paternal Grandmother    Hypertension Paternal Grandfather     Social History   Socioeconomic History   Marital status: Married    Spouse name: Not on file   Number of children: Not on file   Years of education: Not on file   Highest education level: Bachelor's degree (e.g., BA, AB, BS)  Occupational History   Not on file  Tobacco Use   Smoking status: Never   Smokeless tobacco: Never  Vaping Use   Vaping status: Never Used  Substance and Sexual Activity   Alcohol use: Yes    Alcohol/week: 1.0 standard drink of alcohol    Types: 1 Glasses of wine per week   Drug use: No   Sexual activity: Yes  Other Topics Concern   Not on file  Social History Narrative   Not on file   Social Drivers of Health   Financial Resource Strain: Low Risk  (05/01/2024)   Overall Financial Resource Strain (CARDIA)    Difficulty of Paying Living Expenses: Not hard at all  Food Insecurity: No Food Insecurity (05/01/2024)   Hunger Vital Sign    Worried About Running Out of Food in the Last Year: Never true    Ran Out of Food in the Last Year: Never true  Transportation Needs: No Transportation Needs (05/01/2024)   PRAPARE - Administrator, Civil Service (Medical): No    Lack of Transportation (Non-Medical): No  Physical Activity: Sufficiently Active (05/01/2024)   Exercise Vital Sign    Days of Exercise per Week: 4 days    Minutes of Exercise per Session: 60 min  Stress: No Stress Concern Present (05/01/2024)   [employer] of Occupational Health - Occupational Stress Questionnaire    Feeling of Stress: Not at all  Social Connections: Socially Integrated (05/01/2024)   Social Connection and Isolation Panel    Frequency of Communication with Friends and Family: More than three times a week     Frequency of Social Gatherings with Friends and Family: Three times a week    Attends Religious Services: More than 4 times per year    Active Member of Clubs or Organizations: Yes    Attends Engineer, Structural: More than 4 times per year    Marital Status: Married  Catering Manager Violence: Not on file    Outpatient Medications Prior to Visit  Medication Sig Dispense Refill   atorvastatin  (LIPITOR) 80 MG tablet Take 1 tablet (80 mg total) by mouth daily. 90 tablet 1   Cholecalciferol (VITAMIN D3) 5000 units CAPS Take 1 capsule by mouth daily.     co-enzyme Q-10 50 MG capsule Take 50 mg by mouth daily.     ketoconazole  (NIZORAL ) 2 % cream Apply 1 application. topically daily. 15 g 0   lisinopril  (ZESTRIL ) 20 MG tablet TAKE 1 TABLET BY MOUTH DAILY 90 tablet 0   Multiple Vitamins-Minerals (MULTIVITAMIN ADULT PO) Take 1 capsule  by mouth daily.     Omega-3 Fatty Acids (OMEGA 3 500) 500 MG CAPS Take 500 mg by mouth daily.     pantoprazole  (PROTONIX ) 20 MG tablet Take 1 tablet (20 mg total) by mouth daily as needed for heartburn or indigestion. 90 tablet 1   testosterone cypionate (DEPOTESTOSTERONE CYPIONATE) 200 MG/ML injection Inject 1 mL into the muscle every 14 (fourteen) days.      triamcinolone  cream (KENALOG ) 0.1 % Apply 1 Application topically 2 (two) times daily. 80 g 0   sildenafil (REVATIO) 20 MG tablet SMARTSIG:1-5 Tablet(s) By Mouth Daily PRN     No facility-administered medications prior to visit.    Allergies  Allergen Reactions   Latex Rash    Only if wears gloves for extended period    ROS Review of Systems  Constitutional:  Negative for chills and unexpected weight change.  HENT:  Positive for congestion, rhinorrhea and sinus pressure. Negative for ear pain.   Eyes:  Positive for discharge, redness and itching.  Respiratory:  Positive for cough. Negative for shortness of breath and wheezing.       Objective:    Physical Exam Constitutional:       Appearance: Normal appearance.  HENT:     Head: Normocephalic and atraumatic.     Right Ear: Tympanic membrane, ear canal and external ear normal.     Left Ear: Tympanic membrane, ear canal and external ear normal.     Nose: Congestion present.     Mouth/Throat:     Mouth: Mucous membranes are moist.     Pharynx: Posterior oropharyngeal erythema present.  Eyes:     General: Lids are normal.        Right eye: No discharge.        Left eye: No discharge.     Extraocular Movements: Extraocular movements intact.     Conjunctiva/sclera:     Right eye: Right conjunctiva is injected.     Left eye: Left conjunctiva is injected.     Pupils: Pupils are equal, round, and reactive to light.  Cardiovascular:     Rate and Rhythm: Normal rate and regular rhythm.  Pulmonary:     Effort: Pulmonary effort is normal.     Breath sounds: Normal breath sounds.  Skin:    General: Skin is warm and dry.  Neurological:     General: No focal deficit present.     Mental Status: He is alert. Mental status is at baseline.  Psychiatric:        Mood and Affect: Mood normal.        Behavior: Behavior normal.     BP 130/82 (Cuff Size: Large)   Pulse 78   Temp 98.2 F (36.8 C) (Oral)   Resp (!) 78   Ht 6' 1 (1.854 m)   Wt 262 lb 9.6 oz (119.1 kg)   SpO2 96%   BMI 34.65 kg/m  Wt Readings from Last 3 Encounters:  05/01/24 262 lb 9.6 oz (119.1 kg)  10/29/23 264 lb (119.7 kg)  04/16/23 253 lb 3.2 oz (114.9 kg)     Health Maintenance Due  Topic Date Due   Pneumococcal Vaccine: 50+ Years (1 of 2 - PCV) Never done   Hepatitis B Vaccines 19-59 Average Risk (1 of 3 - 19+ 3-dose series) Never done   Influenza Vaccine  01/07/2024   COVID-19 Vaccine (3 - 2025-26 season) 02/07/2024         Topic Date Due   Hepatitis  B Vaccines 19-59 Average Risk (1 of 3 - 19+ 3-dose series) Never done    Lab Results  Component Value Date   TSH 0.918 09/10/2020   Lab Results  Component Value Date   WBC 4.7  11/10/2023   HGB 17.8 (H) 11/10/2023   HCT 55.1 (H) 11/10/2023   MCV 89 11/10/2023   PLT 207 11/10/2023   Lab Results  Component Value Date   NA 141 11/10/2023   K 4.5 11/10/2023   CO2 25 11/10/2023   GLUCOSE 76 11/10/2023   BUN 21 11/10/2023   CREATININE 1.74 (H) 11/10/2023   BILITOT 0.7 11/10/2023   ALKPHOS 64 11/10/2023   AST 40 11/10/2023   ALT 57 (H) 11/10/2023   PROT 6.8 11/10/2023   ALBUMIN 4.4 11/10/2023   CALCIUM  9.6 11/10/2023   EGFR 45 (L) 11/10/2023   Lab Results  Component Value Date   CHOL 185 11/10/2023   Lab Results  Component Value Date   HDL 54 11/10/2023   Lab Results  Component Value Date   LDLCALC 112 (H) 11/10/2023   Lab Results  Component Value Date   TRIG 107 11/10/2023   Lab Results  Component Value Date   CHOLHDL 3.4 11/10/2023   No results found for: HGBA1C    Assessment & Plan:   Assessment & Plan  Acute upper respiratory infection with allergic conjunctivitis Congestion, thick mucus, and eye drainage for 1.5 weeks. No bacterial infection signs. Likely viral URI and allergic conjunctivitis. - Recommended Zaditor drops for allergies. - Recommended Refresh lubricating drops for eye moisture. - Advised to report if symptoms worsen or if fever or one-sided sinus pain develops.  Essential hypertension Blood pressure controlled. Caution with Mucinex D due to potential BP increase. - Continue current antihypertensive regimen. - Monitor blood pressure, especially when using Mucinex D.  Chronic kidney disease stage 3a GFR 45, indicating well-managed kidney function. Continue Lisinopril .   Mixed hyperlipidemia Cholesterol levels well-controlled with current medication. - Continue current lipid-lowering therapy.   - lisinopril  (ZESTRIL ) 20 MG tablet; Take 1 tablet (20 mg total) by mouth daily.  Dispense: 90 tablet; Refill: 1 - atorvastatin  (LIPITOR) 80 MG tablet; Take 1 tablet (80 mg total) by mouth daily.  Dispense: 90 tablet;  Refill: 1 - pantoprazole  (PROTONIX ) 20 MG tablet; Take 1 tablet (20 mg total) by mouth daily as needed for heartburn or indigestion.  Dispense: 90 tablet; Refill: 1   Follow-up: Return in about 6 months (around 10/29/2024).    Sharyle Fischer, DO

## 2024-06-11 ENCOUNTER — Other Ambulatory Visit: Payer: Self-pay | Admitting: Internal Medicine

## 2024-06-11 DIAGNOSIS — E782 Mixed hyperlipidemia: Secondary | ICD-10-CM

## 2024-06-13 ENCOUNTER — Telehealth: Payer: Self-pay | Admitting: Internal Medicine

## 2024-06-13 NOTE — Telephone Encounter (Signed)
 atorvastatin  (LIPITOR) 80 MG tablet   Prescription was sent to [facility] in November but this is the second request from [facility]

## 2024-06-13 NOTE — Telephone Encounter (Signed)
 Too soon for refill.  Requested Prescriptions  Pending Prescriptions Disp Refills   atorvastatin  (LIPITOR) 80 MG tablet [Pharmacy Med Name: ATORVASTATIN  80MG  TABLETS] 90 tablet 1    Sig: TAKE 1 TABLET(80 MG) BY MOUTH DAILY     Cardiovascular:  Antilipid - Statins Failed - 06/13/2024 10:39 AM      Failed - Lipid Panel in normal range within the last 12 months    Cholesterol, Total  Date Value Ref Range Status  11/10/2023 185 100 - 199 mg/dL Final   LDL Chol Calc (NIH)  Date Value Ref Range Status  11/10/2023 112 (H) 0 - 99 mg/dL Final   HDL  Date Value Ref Range Status  11/10/2023 54 >39 mg/dL Final   Triglycerides  Date Value Ref Range Status  11/10/2023 107 0 - 149 mg/dL Final         Passed - Patient is not pregnant      Passed - Valid encounter within last 12 months    Recent Outpatient Visits           1 month ago Essential hypertension, benign   [facility] Bernardo Fend, [place]   7 months ago Essential hypertension, benign   [facility] Bernardo Fend, [place]

## 2024-11-02 ENCOUNTER — Ambulatory Visit: Admitting: Internal Medicine
